# Patient Record
Sex: Female | Born: 1935
Health system: Southern US, Community
[De-identification: ages and names within clinical notes are randomized; demographics above are authoritative.]

## PROBLEM LIST (undated history)

## (undated) DIAGNOSIS — K5790 Diverticulosis of intestine, part unspecified, without perforation or abscess without bleeding: Secondary | ICD-10-CM

## (undated) DIAGNOSIS — I671 Cerebral aneurysm, nonruptured: Secondary | ICD-10-CM

## (undated) DIAGNOSIS — I10 Essential (primary) hypertension: Secondary | ICD-10-CM

## (undated) DIAGNOSIS — R32 Unspecified urinary incontinence: Secondary | ICD-10-CM

## (undated) DIAGNOSIS — K635 Polyp of colon: Secondary | ICD-10-CM

## (undated) HISTORY — DX: Unspecified urinary incontinence: R32

## (undated) HISTORY — PX: OTHER SURGICAL HISTORY: SHX169

## (undated) HISTORY — DX: Diverticulosis of intestine, part unspecified, without perforation or abscess without bleeding: K57.90

## (undated) HISTORY — DX: Essential (primary) hypertension: I10

## (undated) HISTORY — DX: Polyp of colon: K63.5

## (undated) HISTORY — DX: Cerebral aneurysm, nonruptured: I67.1

---

## 2003-08-09 ENCOUNTER — Encounter: Admission: RE | Admit: 2003-08-09 | Discharge: 2003-08-09 | Payer: Self-pay | Admitting: Internal Medicine

## 2003-12-08 ENCOUNTER — Ambulatory Visit (HOSPITAL_COMMUNITY): Admission: RE | Admit: 2003-12-08 | Discharge: 2003-12-08 | Payer: Self-pay | Admitting: Internal Medicine

## 2004-05-31 ENCOUNTER — Ambulatory Visit: Payer: Self-pay | Admitting: Internal Medicine

## 2004-06-21 ENCOUNTER — Ambulatory Visit: Payer: Self-pay | Admitting: Internal Medicine

## 2004-07-06 ENCOUNTER — Ambulatory Visit: Payer: Self-pay | Admitting: Internal Medicine

## 2004-07-06 ENCOUNTER — Other Ambulatory Visit: Admission: RE | Admit: 2004-07-06 | Discharge: 2004-07-06 | Payer: Self-pay | Admitting: Internal Medicine

## 2004-07-06 LAB — HM PAP SMEAR

## 2004-07-08 ENCOUNTER — Ambulatory Visit: Payer: Self-pay | Admitting: Internal Medicine

## 2004-07-08 LAB — HM COLONOSCOPY

## 2004-07-13 ENCOUNTER — Ambulatory Visit: Payer: Self-pay | Admitting: Internal Medicine

## 2004-09-06 ENCOUNTER — Ambulatory Visit: Payer: Self-pay | Admitting: Internal Medicine

## 2005-01-31 ENCOUNTER — Ambulatory Visit (HOSPITAL_COMMUNITY): Admission: RE | Admit: 2005-01-31 | Discharge: 2005-01-31 | Payer: Self-pay | Admitting: Internal Medicine

## 2009-06-12 ENCOUNTER — Encounter (INDEPENDENT_AMBULATORY_CARE_PROVIDER_SITE_OTHER): Payer: Self-pay | Admitting: *Deleted

## 2010-04-13 NOTE — Letter (Signed)
Summary: Colonoscopy Letter  [facility]  [address] [place], [place] [address]   Phone: [phone]  Fax: [phone]      June 12, 2009 MRN: [id number]   [facility] [address] [place], [place]  [address]   Dear Ms. Robotham,   According to your medical record, it is time for you to schedule a Colonoscopy. The American Cancer Society recommends this procedure as a method to detect early colon cancer. Patients with a family history of colon cancer, or a personal history of colon polyps or inflammatory bowel disease are at increased risk.  This letter has beeen generated based on the recommendations made at the time of your procedure. If you feel that in your particular situation this may no longer apply, please contact our office.  Please call our office at [phone] to schedule this appointment or to update your records at your earliest convenience.  Thank you for cooperating with [place] to provide you with the very best care possible.   Sincerely,  Iva Boop, M.D.  [facility] [phone]

## 2010-10-14 ENCOUNTER — Telehealth: Payer: Self-pay | Admitting: Gastroenterology

## 2010-10-14 NOTE — Telephone Encounter (Signed)
Talked to the patient and she stated that her husband is under chemotherapy care and right now things are difficult. Patient stated that she know she needs this done and she will call us back to schedule her colonoscopy appointment.

## 2012-12-12 ENCOUNTER — Ambulatory Visit (INDEPENDENT_AMBULATORY_CARE_PROVIDER_SITE_OTHER): Payer: Medicare Other | Admitting: Family

## 2012-12-12 ENCOUNTER — Encounter: Payer: Self-pay | Admitting: Family

## 2012-12-12 VITALS — BP 142/80 | HR 67 | Ht 62.0 in | Wt 154.0 lb

## 2012-12-12 DIAGNOSIS — Z860100 Personal history of colon polyps, unspecified: Secondary | ICD-10-CM

## 2012-12-12 DIAGNOSIS — Z23 Encounter for immunization: Secondary | ICD-10-CM

## 2012-12-12 DIAGNOSIS — I10 Essential (primary) hypertension: Secondary | ICD-10-CM

## 2012-12-12 DIAGNOSIS — K5730 Diverticulosis of large intestine without perforation or abscess without bleeding: Secondary | ICD-10-CM

## 2012-12-12 LAB — CBC WITH DIFFERENTIAL/PLATELET
Basophils Absolute: 0 10*3/uL (ref 0.0–0.1)
Eosinophils Absolute: 0.2 10*3/uL (ref 0.0–0.7)
Eosinophils Relative: 4 % (ref 0.0–5.0)
Lymphocytes Relative: 28.6 % (ref 12.0–46.0)
MCHC: 33.5 g/dL (ref 30.0–36.0)
MCV: 90.8 fl (ref 78.0–100.0)
Monocytes Relative: 8 % (ref 3.0–12.0)
Neutrophils Relative %: 58.9 % (ref 43.0–77.0)
Platelets: 165 10*3/uL (ref 150.0–400.0)
RBC: 4.5 Mil/uL (ref 3.87–5.11)
RDW: 14.1 % (ref 11.5–14.6)
WBC: 5.6 10*3/uL (ref 4.5–10.5)

## 2012-12-12 LAB — BASIC METABOLIC PANEL
BUN: 15 mg/dL (ref 6–23)
CO2: 31 mEq/L (ref 19–32)
Chloride: 106 mEq/L (ref 96–112)
Creatinine, Ser: 1 mg/dL (ref 0.4–1.2)
GFR: 57 mL/min — ABNORMAL LOW (ref >60.00)
Glucose, Bld: 89 mg/dL (ref 70–99)
Potassium: 5.3 mEq/L — ABNORMAL HIGH (ref 3.5–5.1)
Sodium: 142 mEq/L (ref 135–145)

## 2012-12-12 LAB — HEPATIC FUNCTION PANEL
ALT: 16 U/L (ref 0–35)
Albumin: 3.8 g/dL (ref 3.5–5.2)
Alkaline Phosphatase: 75 U/L (ref 39–117)
Bilirubin, Direct: 0.2 mg/dL (ref 0.0–0.3)
Total Bilirubin: 1.2 mg/dL (ref 0.3–1.2)
Total Protein: 7.1 g/dL (ref 6.0–8.3)

## 2012-12-12 NOTE — Progress Notes (Signed)
  Subjective:    Patient ID: Barbara [name], female    DOB: [birth year], 79 y.o.   MRN: [id number]  HPI And 79 year old white female, new patient him to be reestablished. She has a past medical history of hypertension, colon polyps, diverticulosis. She is a former patient of Dr. Cato Mulligan and has not been here for several years. Reports her husband died 2 years ago due to lymphoma. He reportedly walks 3 miles a day and tries to stay active and healthy. She has not had a colonoscopy in several years and does not want one now. Declining a mammogram as well. She is not currently on any medications.  Review of Systems  Constitutional: Negative.   HENT: Negative.   Respiratory: Negative.   Cardiovascular: Negative.   Gastrointestinal: Negative.   Endocrine: Negative.   Genitourinary: Negative.   Musculoskeletal: Negative.   Skin: Negative.   Allergic/Immunologic: Negative.   Neurological: Negative.   Hematological: Negative.   Psychiatric/Behavioral: Negative.    Past Medical History  Diagnosis Date  . Diverticulosis   . Hypertension   . Colonic polyp   . Urine incontinence     History   Social History  . Marital Status: Married    Spouse Name: N/A    Number of Children: N/A  . Years of Education: N/A   Occupational History  . Not on file.   Social History Main Topics  . Smoking status: Former Games developer  . Smokeless tobacco: Not on file  . Alcohol Use: No  . Drug Use: No  . Sexual Activity: Not on file   Other Topics Concern  . Not on file   Social History Narrative  . No narrative on file    History reviewed. No pertinent past surgical history.  No family history on file.  No Known Allergies  No current outpatient prescriptions on file prior to visit.   No current facility-administered medications on file prior to visit.    BP 142/80  Pulse 67  Ht 5\' 2"  (1.575 m)  Wt 154 lb (69.854 kg)  BMI 28.16 kg/m2  SpO2 98%chart    Objective:   Physical Exam   Constitutional: She is oriented to person, place, and time. She appears well-developed and well-nourished.  HENT:  Right Ear: External ear normal.  Left Ear: External ear normal.  Nose: Nose normal.  Mouth/Throat: Oropharynx is clear and moist.  Neck: Normal range of motion. Neck supple.  Cardiovascular: Normal rate, regular rhythm and normal heart sounds.   Pulmonary/Chest: Effort normal and breath sounds normal.  Abdominal: Soft. Bowel sounds are normal.  Musculoskeletal: She exhibits edema.  Neurological: She is alert and oriented to person, place, and time.  Skin: Skin is warm and dry.  Psychiatric: She has a normal mood and affect.          Assessment & Plan:  Assessment: 1. Hypertension 2. Colon polyps 3. Diverticulosis  Plan: Encouraged healthy diet, exercise, monthly self breast exams. Followup for complete physical exam in 3-4 months and sooner as needed.

## 2012-12-18 ENCOUNTER — Telehealth: Payer: Self-pay | Admitting: Family

## 2012-12-18 NOTE — Telephone Encounter (Signed)
Patient wants to discuss her labs. She wanted a cholesterol, but did not see one. Please advise and call.

## 2012-12-19 ENCOUNTER — Other Ambulatory Visit: Payer: Self-pay | Admitting: Family

## 2012-12-19 DIAGNOSIS — Z1231 Encounter for screening mammogram for malignant neoplasm of breast: Secondary | ICD-10-CM

## 2012-12-19 NOTE — Telephone Encounter (Signed)
Pt aware Lipid panel was not drawn because she was not fasting. We will obtain a lipid panel at her [facility]

## 2012-12-28 ENCOUNTER — Ambulatory Visit (HOSPITAL_COMMUNITY)
Admission: RE | Admit: 2012-12-28 | Discharge: 2012-12-28 | Disposition: A | Discharge status: Home / Self Care | Payer: Medicare Other | Source: Ambulatory Visit | Attending: Family | Admitting: Family

## 2012-12-28 DIAGNOSIS — Z1231 Encounter for screening mammogram for malignant neoplasm of breast: Secondary | ICD-10-CM | POA: Insufficient documentation

## 2013-01-03 ENCOUNTER — Encounter: Payer: Self-pay | Admitting: Family

## 2013-01-07 ENCOUNTER — Encounter: Payer: Self-pay | Admitting: Family

## 2013-01-08 ENCOUNTER — Ambulatory Visit (HOSPITAL_COMMUNITY): Payer: Medicare Other

## 2013-01-10 ENCOUNTER — Encounter: Payer: Self-pay | Admitting: Family

## 2013-01-17 ENCOUNTER — Other Ambulatory Visit: Payer: Self-pay

## 2014-09-08 ENCOUNTER — Other Ambulatory Visit: Payer: Self-pay

## 2014-11-18 ENCOUNTER — Telehealth: Payer: Self-pay | Admitting: Family

## 2014-11-18 NOTE — Telephone Encounter (Signed)
We can put her in an acute slot

## 2014-11-18 NOTE — Telephone Encounter (Signed)
Pls advise.  

## 2014-11-18 NOTE — Telephone Encounter (Signed)
Pt has an appt on 12/23/14 to transfer care to [facility].  Pt states she has been experiencing jaw pain that she has seen her dentist for.  Her dentist suggested she follow up with her PCP.  Pt wants to know if she can be worked in prior to 10/11.

## 2014-11-20 NOTE — Telephone Encounter (Signed)
We can put her in an acute spot and I will see her for her jaw pain

## 2014-11-20 NOTE — Telephone Encounter (Signed)
Pt aware she will be seen for jaw pain only and must keep her oct appt to est care.

## 2014-11-20 NOTE — Telephone Encounter (Signed)
Please clarify, would you like pt scheduled/ worked-in in an acute slot for the acute problem she is having now.  Should she keep the appt on 12/23/14 to establish care?

## 2014-11-24 ENCOUNTER — Telehealth: Payer: Self-pay | Admitting: Family

## 2014-11-24 ENCOUNTER — Encounter: Payer: Self-pay | Admitting: Adult Health

## 2014-11-24 ENCOUNTER — Ambulatory Visit (INDEPENDENT_AMBULATORY_CARE_PROVIDER_SITE_OTHER): Payer: Medicare Other | Admitting: Adult Health

## 2014-11-24 VITALS — BP 110/84 | Temp 97.8°F | Ht 62.0 in | Wt 156.0 lb

## 2014-11-24 DIAGNOSIS — R51 Headache: Secondary | ICD-10-CM | POA: Diagnosis not present

## 2014-11-24 DIAGNOSIS — K146 Glossodynia: Secondary | ICD-10-CM

## 2014-11-24 DIAGNOSIS — R519 Headache, unspecified: Secondary | ICD-10-CM

## 2014-11-24 LAB — BASIC METABOLIC PANEL
BUN: 22 mg/dL (ref 6–23)
CHLORIDE: 103 meq/L (ref 96–112)
CO2: 30 meq/L (ref 19–32)
Calcium: 9.7 mg/dL (ref 8.4–10.5)
Creatinine, Ser: 0.86 mg/dL (ref 0.40–1.20)
GFR: 67.5 mL/min (ref >60.00)
Glucose, Bld: 102 mg/dL — ABNORMAL HIGH (ref 70–99)
Potassium: 4.3 mEq/L (ref 3.5–5.1)
Sodium: 142 mEq/L (ref 135–145)

## 2014-11-24 MED ORDER — BACLOFEN 10 MG PO TABS: 10.0000 mg | ORAL_TABLET | Freq: Three times a day (TID) | ORAL | Status: DC

## 2014-11-24 NOTE — Patient Instructions (Signed)
It was great meeting you today.   I have sent in a prescription for Baclofen. Take this three times a day as needed.   I have sent in a referral to the ENT doctors, they will call you to schedule an appointment.   I will follow up with you regarding your  MRI

## 2014-11-24 NOTE — Telephone Encounter (Signed)
Pt can not having mri at [facility] until  12-10-14. Pt would like to see if hospital  can do mri sooner

## 2014-11-24 NOTE — Progress Notes (Signed)
Subjective:    Patient ID: Barbara [name], female    DOB: Mar 04, 1933, 81 y.o.   MRN: [id number]  HPI  81 year old female who presents to the office today for jaw pain/tongue pain/headache that started when she work up on the morning of  August 16th, 2016. The pain started out of both sides of her jaw " it felt like it was on the outside of my jaw." The pain was described as " intense". She wears a mouth guard at night. She denies any feeling of her jaw locking or popping. At the same time, she had "tongue pain", feels as though it is "aching" in quality an can be "burning" at time. Only has pain when she eats.The pain is located " in the back of her tongue".  She continues to have have a headache on the top of her head. This headache has not gone away since August 16th. She has tried Ibuprofen and Tylenol without relief.   The jaw pain has resolved but that she continues to have tongue pain, which has been getting slightly better.   Denies any blurred vision, slurred speech, facial droop. Loss of sensation.    She has been seen and examined by her Dentist who could not figure out a reason of her pain.   Review of Systems  Constitutional: Negative.   HENT: Positive for ear pain (occassional ). Negative for drooling, ear discharge, postnasal drip, rhinorrhea, sinus pressure, tinnitus, trouble swallowing and voice change.   Eyes: Negative.   Respiratory: Negative.   Musculoskeletal: Negative.   Neurological: Positive for headaches. Negative for dizziness, tremors, seizures, syncope, facial asymmetry, speech difficulty, weakness, light-headedness and numbness.  Psychiatric/Behavioral: Negative.   All other systems reviewed and are negative.  Past Medical History  Diagnosis Date  . Diverticulosis   . Hypertension   . Colonic polyp   . Urine incontinence     Social History   Social History  . Marital Status: Married    Spouse Name: N/A  . Number of Children: N/A  . Years of  Education: N/A   Occupational History  . Not on file.   Social History Main Topics  . Smoking status: Former Games developer  . Smokeless tobacco: Not on file  . Alcohol Use: No  . Drug Use: No  . Sexual Activity: Not on file   Other Topics Concern  . Not on file   Social History Narrative    No past surgical history on file.  No family history on file.  No Known Allergies  Current Outpatient Prescriptions on File Prior to Visit  Medication Sig Dispense Refill  . Calcium-Vitamin D-Vitamin K 500-500-40 MG-UNT-MCG CHEW Chew by mouth.    . fish oil-omega-3 fatty acids 1000 MG capsule Take 2 g by mouth daily.    . Methylsulfonylmethane (MSM PO) Take by mouth.    . NON FORMULARY Slimfast powder     No current facility-administered medications on file prior to visit.    BP 110/84 mmHg  Temp(Src) 97.8 F (36.6 C) (Oral)  Ht  (1.575 m)  Wt 156 lb (70.761 kg)  BMI 28.53 kg/m2       Objective:   Physical Exam  Constitutional: She is oriented to person, place, and time. She appears well-developed and well-nourished. No distress.  HENT:  Head: Normocephalic and atraumatic.  Right Ear: External ear normal.  Left Ear: External ear normal.  Nose: Nose normal.  Mouth/Throat: Oropharynx is clear and moist. No oropharyngeal exudate.  No signs of infection inside her mouth.   No popping or locking of jaw. No pain to TMJ with palpation.   Eyes: Conjunctivae and EOM are normal. Pupils are equal, round, and reactive to light. Right eye exhibits no discharge. Left eye exhibits no discharge.  Neck: No thyromegaly present.  Cardiovascular: Normal rate, regular rhythm, normal heart sounds and intact distal pulses.  Exam reveals no gallop and no friction rub.   No murmur heard. Pulmonary/Chest: Effort normal and breath sounds normal. No respiratory distress. She has no wheezes. She has no rales. She exhibits no tenderness.  Musculoskeletal: Normal range of motion. She exhibits no edema  or tenderness.  Lymphadenopathy:    She has no cervical adenopathy.  Neurological: She is alert and oriented to person, place, and time. She has normal reflexes. She displays normal reflexes. A cranial nerve deficit (possible Glossopharyngeal nerve involvement) is present. She exhibits normal muscle tone. Coordination normal.  No signs of stroke  Skin: Skin is warm and dry. No rash noted. She is not diaphoretic. No erythema. No pallor.  Psychiatric: She has a normal mood and affect. Her behavior is normal. Judgment and thought content normal.  Nursing note and vitals reviewed.      Assessment & Plan:  1. Acute nonintractable headache, unspecified headache type - MR Angiogram Head Wo Contrast; Future - MR Brain W Wo Contrast; Future - Basic metabolic panel  2. Tongue pain - possible Glossopharyngeal neuralgia. Cannot rule out  mass lesion or vascular pathology. Possible Trigeminal neuralgia - baclofen (LIORESAL) 10 MG tablet; Take 1 tablet (10 mg total) by mouth 3 (three) times daily.  Dispense: 30 each; Refill: 0 - MR Angiogram Head Wo Contrast; Future - Ambulatory referral to ENT - MR Brain W Wo Contrast; Future - Basic metabolic panel

## 2014-11-24 NOTE — Telephone Encounter (Signed)
Called and spoke with [facility].  The reason pt was scheduled out to 9.28.2016 is because she is claustrophobic and that room fills up quickly.  Per scheduler pt will have to call back periodically to see if there has been a cancellation.  Called and spoke with Gavin Pound [facility] and she states that if pt would like to switch to the hospital the old referral will have to be deleted and the process will start over. Kandee Keen would like for this to be done within the next week.   Spoke with Gavin Pound and she states that [facility] is the only place with an open MRI. Please advise.

## 2014-11-24 NOTE — Telephone Encounter (Signed)
Can we call her and see if she can do the exam without it being an open MRI. If she truly is claustrophobic I can get her an ativan to take prior to the procedure, if she has a ride.

## 2014-11-24 NOTE — Progress Notes (Signed)
Pre visit review using our clinic review tool, if applicable. No additional management support is needed unless otherwise documented below in the visit note. 

## 2014-11-25 NOTE — Telephone Encounter (Signed)
Called and spoke with pt and pt states it is not that she is claustrophobic it is that she has had back and if she lies down flat she will get back spasms.  Pt states she also cancelled the appointment with ENT because she would like to do one thing at a time and the MRI is the first thing she would like to do.  Advised Cory of pt's decision and he states ideally he would like for pt to have MRI asap but if pt would like to wait for the open MRI that is fine.  Advised pt that Kandee Keen is aware and would like pt to seek medical attention if symptoms worsen or new symptoms arise.

## 2014-12-04 ENCOUNTER — Telehealth: Payer: Self-pay | Admitting: Family

## 2014-12-04 NOTE — Telephone Encounter (Signed)
Pt call to say that she found the following  It fits her condition trigeminal neuralgia

## 2014-12-04 NOTE — Telephone Encounter (Signed)
See Below:

## 2014-12-09 ENCOUNTER — Telehealth: Payer: Self-pay | Admitting: Adult Health

## 2014-12-09 NOTE — Telephone Encounter (Signed)
Attempted to call patient about possible trigeminal neurelgia and her upcoming MRI. No answer

## 2014-12-10 ENCOUNTER — Ambulatory Visit
Admission: RE | Admit: 2014-12-10 | Discharge: 2014-12-10 | Disposition: A | Discharge status: Home / Self Care | Payer: Medicare Other | Source: Ambulatory Visit | Attending: Adult Health | Admitting: Adult Health

## 2014-12-10 DIAGNOSIS — K146 Glossodynia: Secondary | ICD-10-CM

## 2014-12-10 DIAGNOSIS — R519 Headache, unspecified: Secondary | ICD-10-CM

## 2014-12-10 DIAGNOSIS — R51 Headache: Principal | ICD-10-CM

## 2014-12-10 MED ORDER — GADOBENATE DIMEGLUMINE 529 MG/ML IV SOLN
14.0000 mL | Freq: Once | INTRAVENOUS | Status: AC | PRN
  Administered 2014-12-10: 14 mL via INTRAVENOUS

## 2014-12-11 ENCOUNTER — Other Ambulatory Visit: Payer: Self-pay | Admitting: Adult Health

## 2014-12-11 ENCOUNTER — Telehealth: Payer: Self-pay | Admitting: Adult Health

## 2014-12-11 ENCOUNTER — Encounter: Payer: Self-pay | Admitting: Adult Health

## 2014-12-11 DIAGNOSIS — I671 Cerebral aneurysm, nonruptured: Secondary | ICD-10-CM | POA: Insufficient documentation

## 2014-12-11 NOTE — Telephone Encounter (Signed)
Spoke to patient on the phone and informed her of her MRI results. 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 I will send in referral to Neuro surgery.

## 2014-12-15 DIAGNOSIS — I671 Cerebral aneurysm, nonruptured: Secondary | ICD-10-CM | POA: Insufficient documentation

## 2014-12-17 ENCOUNTER — Telehealth: Payer: Self-pay | Admitting: Family

## 2014-12-17 NOTE — Telephone Encounter (Signed)
Pt has questions about something she see on mycharts about her MRI would like a call back .

## 2014-12-17 NOTE — Telephone Encounter (Signed)
Attempted to call pt; no answer. Will call again at a later time.

## 2014-12-18 ENCOUNTER — Telehealth: Payer: Self-pay | Admitting: Adult Health

## 2014-12-18 NOTE — Telephone Encounter (Signed)
Pt states in the results part of her angiogram head w/o contrast ( MRI) and  results from [facility] (MRA) Are the same exact words . Pt concerned same results were entered for both Please advise and call back.

## 2014-12-18 NOTE — Telephone Encounter (Signed)
Spoke with pt and pt states she saw the neurosurgeon and had labs done.  Pt states the neurosurgeon also went over her MRI results.  Pt was not sure if the neurosurgeon was going to send the labs to [facility] of if his office was going to call.  Advised pt that the neurosurgeon would contact her about the labs drawn at his office.  Pt verbalized understanding and states she will call him.  Advised pt if further assistance was needed to call back.

## 2014-12-18 NOTE — Telephone Encounter (Signed)
Attempted to call pt no answer  

## 2014-12-18 NOTE — Telephone Encounter (Signed)
DUPLICATE MESSAGE: Please review and advise.   Pt states in the results part of her angiogram head w/o contrast ( MRI) and results from [facility] (MRA). Are the same exact words . Pt concerned same results were entered for both Please advise and call back.

## 2014-12-19 NOTE — Telephone Encounter (Signed)
Called and spoke with pt and pt is aware.  

## 2014-12-19 NOTE — Telephone Encounter (Signed)
That is correct. They are two separate tests but are evaluated together.

## 2014-12-23 ENCOUNTER — Ambulatory Visit: Payer: Self-pay | Admitting: Adult Health

## 2014-12-23 ENCOUNTER — Ambulatory Visit: Admit: 2014-12-23 | Payer: Self-pay | Admitting: Neurosurgery

## 2014-12-23 SURGERY — BIOPSY TEMPORAL ARTERY
Anesthesia: General

## 2014-12-24 ENCOUNTER — Telehealth: Payer: Self-pay | Admitting: Adult Health

## 2014-12-24 DIAGNOSIS — I671 Cerebral aneurysm, nonruptured: Secondary | ICD-10-CM

## 2014-12-24 DIAGNOSIS — M316 Other giant cell arteritis: Secondary | ICD-10-CM

## 2014-12-24 NOTE — Telephone Encounter (Signed)
Pt said the neuro doctor suggest she see a Rheumatologist. Pt is calling asking for a referral to see that kind of doctor. Pt would like a call back.

## 2014-12-24 NOTE — Telephone Encounter (Signed)
Ok per [facility] to place referral. Called and spoke with pt and pt is aware that referral will be placed.  Advised pt that we had received office notes from Neuro.

## 2014-12-25 ENCOUNTER — Encounter: Payer: Self-pay | Admitting: Adult Health

## 2014-12-26 ENCOUNTER — Encounter: Payer: Self-pay | Admitting: Adult Health

## 2014-12-29 ENCOUNTER — Telehealth: Payer: Self-pay | Admitting: Adult Health

## 2014-12-29 NOTE — Telephone Encounter (Signed)
Ok with me 

## 2014-12-29 NOTE — Telephone Encounter (Signed)
That is ok with me  

## 2014-12-29 NOTE — Telephone Encounter (Signed)
Barbara [name],  Patient is requesting to switch to Dr. Lawerance BachBurns over here @ [facility]. Is this something you're ok with?   Dr. Lawerance BachBurns,  Would you be ok with accepting this patient as a transfer?

## 2014-12-29 NOTE — Telephone Encounter (Signed)
Called scheduled appt  °

## 2015-01-05 ENCOUNTER — Other Ambulatory Visit (INDEPENDENT_AMBULATORY_CARE_PROVIDER_SITE_OTHER): Payer: Medicare Other

## 2015-01-05 ENCOUNTER — Ambulatory Visit (INDEPENDENT_AMBULATORY_CARE_PROVIDER_SITE_OTHER): Payer: Medicare Other | Admitting: Internal Medicine

## 2015-01-05 ENCOUNTER — Encounter: Payer: Self-pay | Admitting: Internal Medicine

## 2015-01-05 VITALS — BP 150/92 | HR 62 | Temp 98.4°F | Resp 16 | Ht 63.0 in | Wt 151.0 lb

## 2015-01-05 DIAGNOSIS — I671 Cerebral aneurysm, nonruptured: Secondary | ICD-10-CM | POA: Diagnosis not present

## 2015-01-05 DIAGNOSIS — M316 Other giant cell arteritis: Secondary | ICD-10-CM

## 2015-01-05 DIAGNOSIS — Z8739 Personal history of other diseases of the musculoskeletal system and connective tissue: Secondary | ICD-10-CM | POA: Insufficient documentation

## 2015-01-05 LAB — CBC WITH DIFFERENTIAL/PLATELET
Basophils Absolute: 0 10*3/uL (ref 0.0–0.1)
Basophils Relative: 0 % (ref 0.0–3.0)
EOS ABS: 0 10*3/uL (ref 0.0–0.7)
Eosinophils Relative: 0 % (ref 0.0–5.0)
HEMATOCRIT: 45.8 % (ref 36.0–46.0)
HEMOGLOBIN: 15.1 g/dL — AB (ref 12.0–15.0)
Lymphocytes Relative: 6.7 % — ABNORMAL LOW (ref 12.0–46.0)
Lymphs Abs: 0.9 10*3/uL (ref 0.7–4.0)
MCHC: 32.9 g/dL (ref 30.0–36.0)
MCV: 89.9 fl (ref 78.0–100.0)
Monocytes Absolute: 0.3 10*3/uL (ref 0.1–1.0)
Monocytes Relative: 2.5 % — ABNORMAL LOW (ref 3.0–12.0)
NEUTROS ABS: 11.9 10*3/uL — AB (ref 1.4–7.7)
Neutrophils Relative %: 90.8 % — ABNORMAL HIGH (ref 43.0–77.0)
PLATELETS: 145 10*3/uL — AB (ref 150.0–400.0)
RBC: 5.09 Mil/uL (ref 3.87–5.11)
RDW: 16.3 % — ABNORMAL HIGH (ref 11.5–15.5)
WBC: 13.1 10*3/uL — ABNORMAL HIGH (ref 4.0–10.5)

## 2015-01-05 LAB — COMPREHENSIVE METABOLIC PANEL
ALT: 34 U/L (ref 0–35)
AST: 25 U/L (ref 0–37)
Albumin: 3.7 g/dL (ref 3.5–5.2)
Alkaline Phosphatase: 84 U/L (ref 39–117)
BUN: 32 mg/dL — ABNORMAL HIGH (ref 6–23)
CO2: 31 mEq/L (ref 19–32)
Calcium: 9.8 mg/dL (ref 8.4–10.5)
Chloride: 100 mEq/L (ref 96–112)
Creatinine, Ser: 0.97 mg/dL (ref 0.40–1.20)
GFR: 58.73 mL/min — ABNORMAL LOW (ref >60.00)
Glucose, Bld: 122 mg/dL — ABNORMAL HIGH (ref 70–99)
Potassium: 4.2 mEq/L (ref 3.5–5.1)
Sodium: 138 mEq/L (ref 135–145)
Total Bilirubin: 1.3 mg/dL — ABNORMAL HIGH (ref 0.2–1.2)
Total Protein: 6.6 g/dL (ref 6.0–8.3)

## 2015-01-05 LAB — SEDIMENTATION RATE: Sed Rate: 9 mm/hr (ref 0–22)

## 2015-01-05 LAB — HIGH SENSITIVITY CRP: CRP HIGH SENSITIVITY: 1.03 mg/L (ref 0.000–5.000)

## 2015-01-05 NOTE — Patient Instructions (Addendum)
  We have reviewed your prior records including labs and tests today.  Test(s) ordered today. Your results will be released to MyChart (or called to you) after review, usually within 72hours after test completion. If any changes need to be made, you will be notified at that same time.  Medications reviewed and updated.  No changes recommended at this time.   A referral for rheumatology has been ordered.  Please schedule followup in 2 months

## 2015-01-05 NOTE — Assessment & Plan Note (Signed)
She will follow up with her ophthalmologist

## 2015-01-05 NOTE — Progress Notes (Signed)
Pre visit review using our clinic review tool, if applicable. No additional management support is needed unless otherwise documented below in the visit note. 

## 2015-01-05 NOTE — Assessment & Plan Note (Signed)
Will check esr,crp, cbc Since her symptoms have resolved with the prednisone as long as her blood work is normal we will taper the steroids Already referred to rheumatology Discussed that during the tapering process if she has any symptoms that recur she needs to call immediately so we can increase the steroids

## 2015-01-05 NOTE — Progress Notes (Signed)
Subjective:    Patient ID: Barbara [name], female    DOB: 18-Nov-1932, 81 y.o.   MRN: [id number]  HPI She is here to establish with a new pcp.  She was recently diagnosed with temporal arteritis and has questions regarding it.   August 15th she woke up with bilateral jaw and tongue pain.  Her temporal artery was swollen and painful on each side.  She had a painful spot on her left upper head.  The pain was severe.  She first saw her dentist.  The pain slowly diminished.  She was placed on baclofen 9/12, then methylprednisolone on 10/3, then prednisone 10/7.  She is currently taking 40 mg of prednisone a day.  Her symptoms have resolved.  She is having some mild side effects from the prednisone (palpitations, lightheadedness).  She has been referred to rheumatology.  She had a MRI of her head and was found to have an aneurysm behind her left eye.  She has not yet followed up with her ophthalmologist.  She denies changes in her vision.    Walks three miles day .  palpaiattions on occ, inc thrist  Medications and allergies reviewed with patient and updated if appropriate.  Patient Active Problem List   Diagnosis Date Noted  . Aneurysm, ophthalmic artery 12/11/2014    Past Medical History  Diagnosis Date  . Diverticulosis   . Hypertension   . Colonic polyp   . Urine incontinence   . Aneurysm, ophthalmic artery     Past Surgical History  Procedure Laterality Date  . Disk removal      Social History   Social History  . Marital Status: Married    Spouse Name: N/A  . Number of Children: N/A  . Years of Education: N/A   Social History Main Topics  . Smoking status: Former Games developer  . Smokeless tobacco: None  . Alcohol Use: No  . Drug Use: No  . Sexual Activity: Not Asked   Other Topics Concern  . None   Social History Narrative    Review of Systems  Constitutional: Positive for fatigue (slight with prednisone). Negative for fever and chills.  Eyes: Negative for  visual disturbance.  Respiratory: Negative for cough, shortness of breath and wheezing.   Cardiovascular: Positive for palpitations (from prednisone). Negative for chest pain and leg swelling.  Gastrointestinal: Negative for nausea and abdominal pain.       No GERD  Genitourinary: Negative for dysuria.       Urinary incontinence  Musculoskeletal: Positive for arthralgias (arthritis in hands).  Neurological: Positive for light-headedness (from prednisone). Negative for headaches.  Psychiatric/Behavioral: Negative for dysphoric mood. The patient is not nervous/anxious.        Objective:   Filed Vitals:   01/05/15 1516  BP: 150/92  Pulse: 62  Temp: 98.4 F (36.9 C)  Resp: 16   Filed Weights   01/05/15 1516  Weight: 151 lb (68.493 kg)   Body mass index is 26.76 kg/(m^2).   Physical Exam  Constitutional: She is oriented to person, place, and time. She appears well-developed and well-nourished. No distress.  HENT:  Head: Normocephalic and atraumatic.  Right Ear: External ear normal.  Left Ear: External ear normal.  Mouth/Throat: Oropharynx is clear and moist.  No jaw pain, no temporal artery redness/swelling/pain  Eyes: Conjunctivae are normal.  Neck: Normal range of motion. Neck supple. No tracheal deviation present. No thyromegaly present.  No carotid bruit  Cardiovascular: Normal rate, regular rhythm and normal  heart sounds.   No murmur heard. Pulmonary/Chest: Effort normal and breath sounds normal. No respiratory distress. She has no wheezes.  Abdominal: Soft. She exhibits no distension. There is no tenderness.  Musculoskeletal: She exhibits no edema.  Lymphadenopathy:    She has no cervical adenopathy.  Neurological: She is alert and oriented to person, place, and time. No cranial nerve deficit.  Skin: Skin is warm and dry. No rash noted.  Psychiatric: She has a normal mood and affect. Her behavior is normal.         Assessment & Plan:   Elevated BP: Usually  well controlled Just monitor for now, may be related to steroids  See Problem List.

## 2015-01-06 ENCOUNTER — Other Ambulatory Visit: Payer: Self-pay | Admitting: Emergency Medicine

## 2015-01-06 MED ORDER — PREDNISONE 20 MG PO TABS: 30.0000 mg | ORAL_TABLET | Freq: Every day | ORAL | Status: DC

## 2015-01-07 ENCOUNTER — Telehealth: Payer: Self-pay | Admitting: Emergency Medicine

## 2015-01-07 DIAGNOSIS — M316 Other giant cell arteritis: Secondary | ICD-10-CM

## 2015-01-07 NOTE — Telephone Encounter (Signed)
Referral ordered

## 2015-01-07 NOTE — Telephone Encounter (Signed)
Spoke with pt. She would like Rheum Referral to be put back in. Pt was informed that she may get the same Dr the she was scheduled with before cancelling the appt.

## 2015-01-07 NOTE — Telephone Encounter (Signed)
Returned your phone call

## 2015-01-07 NOTE — Telephone Encounter (Signed)
LVM for pt to call back regarding referral.

## 2015-01-15 ENCOUNTER — Ambulatory Visit: Payer: Medicare Other | Admitting: Internal Medicine

## 2015-01-20 ENCOUNTER — Other Ambulatory Visit (INDEPENDENT_AMBULATORY_CARE_PROVIDER_SITE_OTHER): Payer: Medicare Other

## 2015-01-20 DIAGNOSIS — M316 Other giant cell arteritis: Secondary | ICD-10-CM | POA: Diagnosis not present

## 2015-01-20 LAB — CBC WITH DIFFERENTIAL/PLATELET
BASOS ABS: 0 10*3/uL (ref 0.0–0.1)
Basophils Relative: 0.1 % (ref 0.0–3.0)
Eosinophils Absolute: 0.1 10*3/uL (ref 0.0–0.7)
Eosinophils Relative: 0.7 % (ref 0.0–5.0)
HCT: 45.7 % (ref 36.0–46.0)
Hemoglobin: 14.9 g/dL (ref 12.0–15.0)
Lymphocytes Relative: 6.1 % — ABNORMAL LOW (ref 12.0–46.0)
Lymphs Abs: 0.6 10*3/uL — ABNORMAL LOW (ref 0.7–4.0)
MCHC: 32.7 g/dL (ref 30.0–36.0)
MCV: 92.3 fl (ref 78.0–100.0)
Monocytes Absolute: 0.6 10*3/uL (ref 0.1–1.0)
Monocytes Relative: 5.7 % (ref 3.0–12.0)
Neutro Abs: 8.5 10*3/uL — ABNORMAL HIGH (ref 1.4–7.7)
Neutrophils Relative %: 87.4 % — ABNORMAL HIGH (ref 43.0–77.0)
Platelets: 111 10*3/uL — ABNORMAL LOW (ref 150.0–400.0)
RBC: 4.96 Mil/uL (ref 3.87–5.11)
RDW: 17.5 % — ABNORMAL HIGH (ref 11.5–15.5)
WBC: 9.8 10*3/uL (ref 4.0–10.5)

## 2015-01-20 LAB — SEDIMENTATION RATE: Sed Rate: 16 mm/hr (ref 0–22)

## 2015-01-20 LAB — C-REACTIVE PROTEIN: CRP: 2.6 mg/dL (ref 0.5–20.0)

## 2015-01-21 ENCOUNTER — Encounter: Payer: Self-pay | Admitting: Internal Medicine

## 2015-03-11 ENCOUNTER — Encounter: Payer: Self-pay | Admitting: Internal Medicine

## 2015-03-11 DIAGNOSIS — D696 Thrombocytopenia, unspecified: Secondary | ICD-10-CM

## 2015-03-11 DIAGNOSIS — M316 Other giant cell arteritis: Secondary | ICD-10-CM

## 2015-03-12 ENCOUNTER — Encounter: Payer: Self-pay | Admitting: Internal Medicine

## 2015-03-12 ENCOUNTER — Other Ambulatory Visit (INDEPENDENT_AMBULATORY_CARE_PROVIDER_SITE_OTHER): Payer: Medicare Other

## 2015-03-12 DIAGNOSIS — D696 Thrombocytopenia, unspecified: Secondary | ICD-10-CM

## 2015-03-12 DIAGNOSIS — M316 Other giant cell arteritis: Secondary | ICD-10-CM

## 2015-03-12 LAB — CBC WITH DIFFERENTIAL/PLATELET
Basophils Absolute: 0 10*3/uL (ref 0.0–0.1)
Basophils Relative: 0.5 % (ref 0.0–3.0)
EOS PCT: 4.8 % (ref 0.0–5.0)
Eosinophils Absolute: 0.4 10*3/uL (ref 0.0–0.7)
HEMATOCRIT: 38.2 % (ref 36.0–46.0)
Hemoglobin: 12.6 g/dL (ref 12.0–15.0)
LYMPHS ABS: 1.8 10*3/uL (ref 0.7–4.0)
Lymphocytes Relative: 22.4 % (ref 12.0–46.0)
MCHC: 33.2 g/dL (ref 30.0–36.0)
MCV: 89.5 fl (ref 78.0–100.0)
MONOS PCT: 7.6 % (ref 3.0–12.0)
Monocytes Absolute: 0.6 10*3/uL (ref 0.1–1.0)
NEUTROS PCT: 64.7 % (ref 43.0–77.0)
Neutro Abs: 5.2 10*3/uL (ref 1.4–7.7)
Platelets: 240 10*3/uL (ref 150.0–400.0)
RBC: 4.26 Mil/uL (ref 3.87–5.11)
RDW: 15.5 % (ref 11.5–15.5)
WBC: 8 10*3/uL (ref 4.0–10.5)

## 2015-03-12 LAB — C-REACTIVE PROTEIN: CRP: 1.8 mg/dL (ref 0.5–20.0)

## 2015-03-12 LAB — SEDIMENTATION RATE: Sed Rate: 83 mm/hr — ABNORMAL HIGH (ref 0–22)

## 2015-03-13 ENCOUNTER — Telehealth: Payer: Self-pay | Admitting: Internal Medicine

## 2015-03-13 DIAGNOSIS — M316 Other giant cell arteritis: Secondary | ICD-10-CM

## 2015-03-13 DIAGNOSIS — R700 Elevated erythrocyte sedimentation rate: Secondary | ICD-10-CM

## 2015-03-13 NOTE — Telephone Encounter (Signed)
Spoke with pt and she feels that the current Rheumatologist is treating her for what she was sent to him for and would like another referral. Pt is aware that this may take a while to get in for another appt. She has an appt Thursday and would like to know if she should go to that appt. She mentioned being put back on Prednisone, please advise.

## 2015-03-13 NOTE — Telephone Encounter (Signed)
Would like to know if Dr. Lawerance BachBurns is going to refer her to a rheumatologist based on her last lab results

## 2015-03-13 NOTE — Telephone Encounter (Signed)
Referral placed for rheum. What symptoms is she having?  I do not want to just put her on steroids - ideally rheumatology should see her when she has active symptoms.  She should definitely go on thursday.

## 2015-03-18 ENCOUNTER — Encounter: Payer: Self-pay | Admitting: Internal Medicine

## 2015-03-18 ENCOUNTER — Ambulatory Visit (INDEPENDENT_AMBULATORY_CARE_PROVIDER_SITE_OTHER): Payer: Medicare Other | Admitting: Internal Medicine

## 2015-03-18 VITALS — BP 168/98 | HR 71 | Temp 98.1°F | Resp 16 | Wt 154.0 lb

## 2015-03-18 DIAGNOSIS — I10 Essential (primary) hypertension: Secondary | ICD-10-CM | POA: Insufficient documentation

## 2015-03-18 DIAGNOSIS — R030 Elevated blood-pressure reading, without diagnosis of hypertension: Secondary | ICD-10-CM

## 2015-03-18 DIAGNOSIS — M316 Other giant cell arteritis: Secondary | ICD-10-CM

## 2015-03-18 DIAGNOSIS — IMO0001 Reserved for inherently not codable concepts without codable children: Secondary | ICD-10-CM

## 2015-03-18 MED ORDER — PREDNISONE 20 MG PO TABS: 40.0000 mg | ORAL_TABLET | Freq: Every day | ORAL | Status: DC

## 2015-03-18 NOTE — Patient Instructions (Addendum)
  We have reviewed your prior records including labs and tests today.  Test(s) ordered today. Please have blood work done in 1 week. Your results will be released to MyChart (or called to you) after review, usually within 72hours after test completion. If any changes need to be made, you will be notified at that same time.  Medications reviewed and updated. Changes include starting prednisone 40 mg daily.    Your prescription(s) have been submitted to your pharmacy. Please take as directed and contact our office if you believe you are having problem(s) with the medication(s).  Start monitoring your blood pressure daily.

## 2015-03-18 NOTE — Assessment & Plan Note (Addendum)
?  Truly temporal arteritis vs other inflammatory cause ESR elevated Will restart prednisone at 40 mg daily Referral ordered last week for rheumatology - Dr. Eartha Inch esr, crp, cbc in one week Hopefully will be able to establish with rheumatology quickly and they can manage/taper prednisone She will take prednisone with food and monitor for symptoms of GERD, gastric upset - may need prophylactic PPI

## 2015-03-18 NOTE — Assessment & Plan Note (Signed)
BP elevated the last two visits She will purchase a BP monitor for home and start monitoring her BP daily She will update me via [employer] [facility]

## 2015-03-18 NOTE — Progress Notes (Signed)
Pre visit review using our clinic review tool, if applicable. No additional management support is needed unless otherwise documented below in the visit note. 

## 2015-03-18 NOTE — Progress Notes (Signed)
Subjective:    Patient ID: Barbara [name], female    DOB: [birth year], 82 y.o.   MRN: [id number]  HPI  She is here for follow up temporal arteritis. See prior notes for extended past history.  She was diagnosed elsewhere and started on prednisone for presumed temporal arteritis.  She declined a temporal artery biopsy at that time.  She was on prednisone when she started seeing me and I referred her to rheumatology.  Dr. Kellie Simmering did not feel she had temporal arteritis and she was tapered off the steroids.    After she finished the prednisone the headache restarted.  Even on the prednisone she had tenderness on the scalp.  Her current symptoms include:  Her tongue is painful and achy when she eats.  She has tenderness in the left upper posterior parietal region of her head.  She has a headache that is generalized and it is not severe.  The headache moves around a little throughout her head.  Her b/l temporal arteries feel puffy at times and slightly tender at times, but not consistently.    She denies double vision, blurry vision and eye pain.  She denies fevers.   Medications and allergies reviewed with patient and updated if appropriate.  Patient Active Problem List   Diagnosis Date Noted  . Temporal arteritis (HCC) 01/05/2015  . Aneurysm, ophthalmic artery 12/11/2014    Current Outpatient Prescriptions on File Prior to Visit  Medication Sig Dispense Refill  . Calcium-Vitamin D-Vitamin K 500-500-40 MG-UNT-MCG CHEW Chew by mouth.    . fish oil-omega-3 fatty acids 1000 MG capsule Take 2 g by mouth daily.    . Methylsulfonylmethane (MSM PO) Take by mouth.    . NON FORMULARY Slimfast powder    . predniSONE (DELTASONE) 20 MG tablet Take 1.5 tablets (30 mg total) by mouth daily with breakfast. 45 tablet 0   No current facility-administered medications on file prior to visit.    Past Medical History  Diagnosis Date  . Diverticulosis   . Hypertension   . Colonic polyp   . Urine  incontinence   . Aneurysm, ophthalmic artery     Past Surgical History  Procedure Laterality Date  . Disk removal      Social History   Social History  . Marital Status: Married    Spouse Name: N/A  . Number of Children: N/A  . Years of Education: N/A   Social History Main Topics  . Smoking status: Former Games developer  . Smokeless tobacco: None  . Alcohol Use: No  . Drug Use: No  . Sexual Activity: Not Asked   Other Topics Concern  . None   Social History Narrative    No family history on file.  Review of Systems  Constitutional: Positive for fatigue. Negative for fever.  HENT: Negative for congestion, nosebleeds and sinus pressure.   Eyes: Negative for pain and visual disturbance.  Respiratory: Negative for cough, shortness of breath and wheezing.   Cardiovascular: Positive for palpitations (occasional fluttering, transient) and leg swelling (ankle swelling in morning). Negative for chest pain.  Neurological: Positive for light-headedness (possibly one episode, transient) and headaches. Negative for dizziness, weakness and numbness.       Objective:   Filed Vitals:   03/18/15 0947  BP: 168/98  Pulse: 71  Temp: 98.1 F (36.7 C)  Resp: 16   Filed Weights   03/18/15 0947  Weight: 154 lb (69.854 kg)   Body mass index is 27.29 kg/(m^2).  Physical Exam  Constitutional: She is oriented to person, place, and time. She appears well-developed and well-nourished. No distress.  HENT:  Head: Normocephalic and atraumatic.  Right Ear: External ear normal.  Left Ear: External ear normal.  Mouth/Throat: Oropharynx is clear and moist. No oropharyngeal exudate.  Normal ear canals and TM bilaterally  Eyes: Conjunctivae are normal.  Neck: No tracheal deviation present. No thyromegaly present.  No carotid bruit  Cardiovascular: Normal rate, regular rhythm and normal heart sounds.   Pulmonary/Chest: Effort normal and breath sounds normal. No respiratory distress. She has no  wheezes. She has no rales.  Lymphadenopathy:    She has no cervical adenopathy.  Neurological: She is alert and oriented to person, place, and time. No cranial nerve deficit.  Skin: Skin is warm and dry. No rash noted. She is not diaphoretic. No erythema.  Mild tenderness left posterior parietal region, no lesions  Psychiatric: She has a normal mood and affect. Her behavior is normal.          Assessment & Plan:   See Problem List for A/P

## 2015-03-28 ENCOUNTER — Encounter: Payer: Self-pay | Admitting: Internal Medicine

## 2015-03-30 ENCOUNTER — Other Ambulatory Visit: Payer: Self-pay | Admitting: Rheumatology

## 2015-03-30 DIAGNOSIS — M810 Age-related osteoporosis without current pathological fracture: Secondary | ICD-10-CM

## 2015-04-02 ENCOUNTER — Ambulatory Visit
Admission: RE | Admit: 2015-04-02 | Discharge: 2015-04-02 | Disposition: A | Discharge status: Home / Self Care | Payer: Medicare Other | Source: Ambulatory Visit | Attending: Rheumatology | Admitting: Rheumatology

## 2015-04-02 DIAGNOSIS — M810 Age-related osteoporosis without current pathological fracture: Secondary | ICD-10-CM

## 2015-04-15 DIAGNOSIS — M316 Other giant cell arteritis: Secondary | ICD-10-CM | POA: Diagnosis not present

## 2015-04-15 DIAGNOSIS — M810 Age-related osteoporosis without current pathological fracture: Secondary | ICD-10-CM | POA: Diagnosis not present

## 2015-04-15 DIAGNOSIS — R51 Headache: Secondary | ICD-10-CM | POA: Diagnosis not present

## 2015-04-15 DIAGNOSIS — R6884 Jaw pain: Secondary | ICD-10-CM | POA: Diagnosis not present

## 2015-04-15 DIAGNOSIS — R5383 Other fatigue: Secondary | ICD-10-CM | POA: Diagnosis not present

## 2015-05-13 DIAGNOSIS — Z7952 Long term (current) use of systemic steroids: Secondary | ICD-10-CM | POA: Diagnosis not present

## 2015-05-13 DIAGNOSIS — R51 Headache: Secondary | ICD-10-CM | POA: Diagnosis not present

## 2015-05-13 DIAGNOSIS — M810 Age-related osteoporosis without current pathological fracture: Secondary | ICD-10-CM | POA: Diagnosis not present

## 2015-05-13 DIAGNOSIS — R6884 Jaw pain: Secondary | ICD-10-CM | POA: Diagnosis not present

## 2015-05-13 DIAGNOSIS — M316 Other giant cell arteritis: Secondary | ICD-10-CM | POA: Diagnosis not present

## 2015-06-03 DIAGNOSIS — H52223 Regular astigmatism, bilateral: Secondary | ICD-10-CM | POA: Diagnosis not present

## 2015-06-10 DIAGNOSIS — Z7952 Long term (current) use of systemic steroids: Secondary | ICD-10-CM | POA: Diagnosis not present

## 2015-06-10 DIAGNOSIS — R51 Headache: Secondary | ICD-10-CM | POA: Diagnosis not present

## 2015-06-10 DIAGNOSIS — M810 Age-related osteoporosis without current pathological fracture: Secondary | ICD-10-CM | POA: Diagnosis not present

## 2015-06-10 DIAGNOSIS — R5383 Other fatigue: Secondary | ICD-10-CM | POA: Diagnosis not present

## 2015-06-10 DIAGNOSIS — M316 Other giant cell arteritis: Secondary | ICD-10-CM | POA: Diagnosis not present

## 2015-08-12 DIAGNOSIS — M316 Other giant cell arteritis: Secondary | ICD-10-CM | POA: Diagnosis not present

## 2015-08-12 DIAGNOSIS — R5383 Other fatigue: Secondary | ICD-10-CM | POA: Diagnosis not present

## 2015-08-12 DIAGNOSIS — Z7952 Long term (current) use of systemic steroids: Secondary | ICD-10-CM | POA: Diagnosis not present

## 2015-08-12 DIAGNOSIS — R6884 Jaw pain: Secondary | ICD-10-CM | POA: Diagnosis not present

## 2015-08-17 DIAGNOSIS — M1812 Unilateral primary osteoarthritis of first carpometacarpal joint, left hand: Secondary | ICD-10-CM | POA: Diagnosis not present

## 2015-08-17 DIAGNOSIS — M180 Bilateral primary osteoarthritis of first carpometacarpal joints: Secondary | ICD-10-CM | POA: Insufficient documentation

## 2015-10-14 DIAGNOSIS — R5383 Other fatigue: Secondary | ICD-10-CM | POA: Diagnosis not present

## 2015-10-14 DIAGNOSIS — M316 Other giant cell arteritis: Secondary | ICD-10-CM | POA: Diagnosis not present

## 2015-10-14 DIAGNOSIS — R6884 Jaw pain: Secondary | ICD-10-CM | POA: Diagnosis not present

## 2015-10-14 DIAGNOSIS — Z7952 Long term (current) use of systemic steroids: Secondary | ICD-10-CM | POA: Diagnosis not present

## 2016-01-01 ENCOUNTER — Encounter: Payer: Self-pay | Admitting: Internal Medicine

## 2016-01-01 ENCOUNTER — Ambulatory Visit (INDEPENDENT_AMBULATORY_CARE_PROVIDER_SITE_OTHER): Payer: Medicare Other | Admitting: Internal Medicine

## 2016-01-01 VITALS — BP 164/94 | HR 76 | Temp 97.9°F | Resp 16 | Ht 63.0 in | Wt 156.0 lb

## 2016-01-01 DIAGNOSIS — H9222 Otorrhagia, left ear: Secondary | ICD-10-CM

## 2016-01-01 DIAGNOSIS — H9191 Unspecified hearing loss, right ear: Secondary | ICD-10-CM | POA: Diagnosis not present

## 2016-01-01 NOTE — Assessment & Plan Note (Signed)
As a result of the ear lavage She denies any pain, change in hearing or discomfort Cottonball placed in year and she will leave that in tonight If she has any symptoms over the weekend or Monday asked her to return to clinic so I can look in the ear

## 2016-01-01 NOTE — Assessment & Plan Note (Signed)
Related to obstruction and excessive cerumen Successfully cleaned out with ear lavage and hearing was restored Discussed home use of 50% warm water and 50% hydrogen peroxide in the future to help clean out the earwax

## 2016-01-01 NOTE — Progress Notes (Signed)
Pre visit review using our clinic review tool, if applicable. No additional management support is needed unless otherwise documented below in the visit note. 

## 2016-01-01 NOTE — Patient Instructions (Addendum)
Your ears were cleaned out today.    There is some bleeding in the left ear.  Keep a cotton ball in the ear tonight. If you have pain, change in your hearing or if you continue to see blood please come back on Monday so we can look in the ear.  In the future you can use warm water and hydrogen peroxide to clean out the wax in your ears.

## 2016-01-01 NOTE — Progress Notes (Signed)
Subjective:    Patient ID: Barbara [name], female    DOB: [birth year], 82 y.o.   MRN: [id number]  HPI She is here for an acute visit.  Decreased hearing right ear: Recently she has noticed that she is not able to hear as well out of the right ear. She believes her left ear was compensating and she did not realize this initially, but it has gotten worse. She denies any pain in the right ear, decreased hearing left ear, pain in the left ear, cold symptoms including nasal congestion, sore throat or fever. She has not had any headaches, lightheadedness or dizziness. She denies any history of excessive wax problems.  Medications and allergies reviewed with patient and updated if appropriate.  Patient Active Problem List   Diagnosis Date Noted  . Elevated blood pressure 03/18/2015  . Temporal arteritis (HCC) 01/05/2015  . Aneurysm, ophthalmic artery 12/11/2014    Current Outpatient Prescriptions on File Prior to Visit  Medication Sig Dispense Refill  . Calcium-Vitamin D-Vitamin K 500-500-40 MG-UNT-MCG CHEW Chew by mouth.    . fish oil-omega-3 fatty acids 1000 MG capsule Take 2 g by mouth daily.    . Methylsulfonylmethane (MSM PO) Take by mouth.    . NON FORMULARY Slimfast powder    . predniSONE (DELTASONE) 20 MG tablet Take 2 tablets (40 mg total) by mouth daily with breakfast. 60 tablet 0   No current facility-administered medications on file prior to visit.     Past Medical History:  Diagnosis Date  . Aneurysm, ophthalmic artery   . Colonic polyp   . Diverticulosis   . Hypertension   . Urine incontinence     Past Surgical History:  Procedure Laterality Date  . disk removal      Social History   Social History  . Marital status: Married    Spouse name: N/A  . Number of children: N/A  . Years of education: N/A   Social History Main Topics  . Smoking status: Former Games developermoker  . Smokeless tobacco: Not on file  . Alcohol use No  . Drug use: No  . Sexual activity: Not on  file   Other Topics Concern  . Not on file   Social History Narrative  . No narrative on file    No family history on file.  Review of Systems  Constitutional: Negative for fever.  HENT: Negative for ear pain and sore throat.   Respiratory: Negative for cough, shortness of breath and wheezing.   Neurological: Negative for dizziness, light-headedness and headaches.       Objective:   Vitals:   01/01/16 1548  BP: (!) 164/94  Pulse: 76  Resp: 16  Temp: 97.9 F (36.6 C)   Filed Weights   01/01/16 1548  Weight: 156 lb (70.8 kg)   Body mass index is 27.63 kg/m.   Physical Exam  Constitutional: She appears well-developed and well-nourished. No distress.  HENT:  Head: Normocephalic and atraumatic.  Preprocedure: Right ear canal with excessive cerumen that is obstructing the ear canal, tympanic membrane unable to be visualized. Left ear canal with moderate cerumen, partially visualized tympanic membrane normal  Patient verbally consented to procedure. CMA used ear lavage in both ear canals.  Post procedure: Right ear canal with mild irritation and skin areas of bleeding.scant cerumen remaining. Tympanic membrane normal. Hearing has been restored. Left ear canal with moderate blood, tympanic membrane unable to be visualized, remainder of the ear canal normal. She denies complaints of pain,  decreased hearing or pressure   Skin: Skin is warm and dry. She is not diaphoretic.          Assessment & Plan:   See Problem List for Assessment and Plan of chronic medical problems.

## 2016-01-14 DIAGNOSIS — M316 Other giant cell arteritis: Secondary | ICD-10-CM | POA: Diagnosis not present

## 2016-01-14 DIAGNOSIS — R51 Headache: Secondary | ICD-10-CM | POA: Diagnosis not present

## 2016-01-14 DIAGNOSIS — M810 Age-related osteoporosis without current pathological fracture: Secondary | ICD-10-CM | POA: Diagnosis not present

## 2016-01-14 DIAGNOSIS — R6884 Jaw pain: Secondary | ICD-10-CM | POA: Diagnosis not present

## 2016-01-14 DIAGNOSIS — Z7952 Long term (current) use of systemic steroids: Secondary | ICD-10-CM | POA: Diagnosis not present

## 2016-04-14 DIAGNOSIS — R6884 Jaw pain: Secondary | ICD-10-CM | POA: Diagnosis not present

## 2016-04-14 DIAGNOSIS — R51 Headache: Secondary | ICD-10-CM | POA: Diagnosis not present

## 2016-04-14 DIAGNOSIS — M316 Other giant cell arteritis: Secondary | ICD-10-CM | POA: Diagnosis not present

## 2016-04-14 DIAGNOSIS — Z7952 Long term (current) use of systemic steroids: Secondary | ICD-10-CM | POA: Diagnosis not present

## 2016-05-04 DIAGNOSIS — M316 Other giant cell arteritis: Secondary | ICD-10-CM | POA: Diagnosis not present

## 2016-05-04 DIAGNOSIS — Z7952 Long term (current) use of systemic steroids: Secondary | ICD-10-CM | POA: Diagnosis not present

## 2016-05-13 ENCOUNTER — Ambulatory Visit (INDEPENDENT_AMBULATORY_CARE_PROVIDER_SITE_OTHER): Payer: Medicare Other | Admitting: Family Medicine

## 2016-05-13 ENCOUNTER — Encounter: Payer: Self-pay | Admitting: Family Medicine

## 2016-05-13 ENCOUNTER — Ambulatory Visit: Payer: Medicare Other | Admitting: Family

## 2016-05-13 ENCOUNTER — Ambulatory Visit (INDEPENDENT_AMBULATORY_CARE_PROVIDER_SITE_OTHER)
Admission: RE | Admit: 2016-05-13 | Discharge: 2016-05-13 | Disposition: A | Discharge status: Home / Self Care | Payer: Medicare Other | Source: Ambulatory Visit | Attending: Family Medicine | Admitting: Family Medicine

## 2016-05-13 VITALS — BP 132/84 | HR 78 | Temp 98.4°F | Resp 12 | Wt 151.8 lb

## 2016-05-13 DIAGNOSIS — J989 Respiratory disorder, unspecified: Secondary | ICD-10-CM

## 2016-05-13 DIAGNOSIS — R0989 Other specified symptoms and signs involving the circulatory and respiratory systems: Secondary | ICD-10-CM

## 2016-05-13 DIAGNOSIS — R05 Cough: Secondary | ICD-10-CM

## 2016-05-13 DIAGNOSIS — R059 Cough, unspecified: Secondary | ICD-10-CM

## 2016-05-13 DIAGNOSIS — J988 Other specified respiratory disorders: Secondary | ICD-10-CM

## 2016-05-13 MED ORDER — BENZONATATE 100 MG PO CAPS: 200.0000 mg | ORAL_CAPSULE | Freq: Two times a day (BID) | ORAL | 0 refills | Status: AC | PRN

## 2016-05-13 MED ORDER — DOXYCYCLINE HYCLATE 100 MG PO TABS: 100.0000 mg | ORAL_TABLET | Freq: Two times a day (BID) | ORAL | 0 refills | Status: DC

## 2016-05-13 MED ORDER — ALBUTEROL SULFATE HFA 108 (90 BASE) MCG/ACT IN AERS: 2.0000 | INHALATION_SPRAY | Freq: Four times a day (QID) | RESPIRATORY_TRACT | 0 refills | Status: DC | PRN

## 2016-05-13 NOTE — Patient Instructions (Signed)
  Ms.Mercy Kohen I have seen you today for an acute visit.  A few things to remember from today's visit:   Cough - Plan: DG Chest 2 View  Reactive airway disease that is not asthma - Plan: DG Chest 2 View  Doxycycline with food.   Albuterol inh 2 puff every 6 hours for a week then as needed for wheezing or shortness of breath.  Today X ray was ordered.  This [facility] done at [facility] between 8 am and 5 pm: [address] [phone].       Medications prescribed today are intended for short period of time and will not be refill upon request, a follow up appointment might be necessary to discuss continuation of of treatment if appropriate.     In general please monitor for signs of worsening symptoms and seek immediate medical attention if any concerning.  Please be sure you have an appointment already scheduled with your PCP before you leave today.

## 2016-05-13 NOTE — Progress Notes (Signed)
HPI:  ACUTE VISIT  Chief Complaint  Patient presents with  . Cough    coughing yello phlegm,fatigue and vomiting    Ms.Barbara [name] is a 83 y.o.female here today with her daughter complaining of 8-9 days of respiratory symptoms.  Productive cough with little amount of sputum, yellowish/tanned, denies hemoptysis. Symptoms started on 05/04/16 with "thraoat tickling", "a lot of coughing",some nasal congestion and rhinorrhea. She has some muscle aching, mainly on back and lower chest,attributes to cough. Cough keeps her from sleep.  + Fever for the past week or so, 100.1 F max,last night 99 F.  She has not noted exertional chest pain, dyspnea, or wheezing.  No Hx of recent travel. Some people and family members have been sick. No known insect bite.  Hx of allergies: No.  Former smoker.  OTC medications for this problem: Mucinex.  Symptoms otherwise stable.  Hx of temporal arteritis,just completed Prednisone taper. Denies Hx of GERD or heartburn. + Occasional nausea caused by coughing spells, no vomiting or diarrhea.    Review of Systems  Constitutional: Positive for appetite change and fatigue. Negative for activity change and fever.  HENT: Positive for congestion and postnasal drip. Negative for mouth sores, sinus pressure, sore throat, trouble swallowing and voice change.   Eyes: Negative for discharge and redness.  Respiratory: Positive for cough. Negative for shortness of breath and wheezing.   Cardiovascular: Negative for chest pain and palpitations.  Gastrointestinal: Negative for abdominal pain, diarrhea and vomiting.  Musculoskeletal: Positive for back pain and myalgias. Negative for neck pain.  Skin: Negative for rash.  Allergic/Immunologic: Negative for environmental allergies.  Neurological: Negative for syncope, weakness and headaches.  Hematological: Negative for adenopathy. Does not bruise/bleed easily.  Psychiatric/Behavioral: Positive for sleep  disturbance. Negative for confusion.      Current Outpatient Prescriptions on File Prior to Visit  Medication Sig Dispense Refill  . Calcium-Vitamin D-Vitamin K 500-500-40 MG-UNT-MCG CHEW Chew by mouth.    . fish oil-omega-3 fatty acids 1000 MG capsule Take 2 g by mouth daily.    . Methylsulfonylmethane (MSM PO) Take by mouth.    . NON FORMULARY Slimfast powder     No current facility-administered medications on file prior to visit.      Past Medical History:  Diagnosis Date  . Aneurysm, ophthalmic artery   . Colonic polyp   . Diverticulosis   . Hypertension   . Urine incontinence    No Known Allergies  Social History   Social History  . Marital status: Married    Spouse name: N/A  . Number of children: N/A  . Years of education: N/A   Social History Main Topics  . Smoking status: Former Games developer  . Smokeless tobacco: Former Neurosurgeon  . Alcohol use No  . Drug use: No  . Sexual activity: Not Asked   Other Topics Concern  . None   Social History Narrative  . None    Vitals:   05/13/16 1043  BP: 132/84  Pulse: 78  Resp: 12  Temp: 98.4 F (36.9 C)  O2 sat at RA 97% Body mass index is 26.89 kg/m.   Physical Exam  Nursing note and vitals reviewed. Constitutional: She is oriented to person, place, and time. She appears well-developed. She does not appear ill. No distress.  HENT:  Head: Atraumatic.  Nose: Rhinorrhea present. Right sinus exhibits no maxillary sinus tenderness and no frontal sinus tenderness. Left sinus exhibits no maxillary sinus tenderness and no frontal  sinus tenderness.  Mouth/Throat: Oropharynx is clear and moist and mucous membranes are normal.  Eyes: Conjunctivae and EOM are normal.  Neck: No muscular tenderness present. No edema and no erythema present.  Cardiovascular: Normal rate and regular rhythm.   No murmur heard. Respiratory: Effort normal. No stridor. No respiratory distress. She has wheezes (with forced expiration). She has no  rales.  Lymphadenopathy:       Head (right side): No submandibular adenopathy present.       Head (left side): No submandibular adenopathy present.    She has cervical adenopathy.       Right cervical: Posterior cervical (1 cm, mobile,no tender) adenopathy present.  Neurological: She is alert and oriented to person, place, and time. She has normal strength. Coordination and gait normal.  Skin: Skin is warm. No rash noted. No erythema.  Psychiatric: Her speech is normal. Her mood appears anxious.  Well groomed, good eye contact.      ASSESSMENT AND PLAN:     Kimbria was seen today for cough.  Diagnoses and all orders for this visit:  Cough  We discussed possible causes. CXR ordered today. Explained that after URI cough and congestion can last a few weeks.  Further recommendations will be given according to imaging results.   -     DG Chest 2 View; Future -     benzonatate (TESSALON) 100 MG capsule; Take 2 capsules (200 mg total) by mouth 2 (two) times daily as needed.  Reactive airway disease that is not asthma  Mild wheezing, non productive cough a few times during OV. She is not interested in Duoneb neb treatment in the office. Albuterol inh 2 puff every 6 hours for a week then as needed for wheezing or shortness of breath.  For now I do not think oral steroids are needed. Instructed about warning signs.  -     albuterol (PROVENTIL HFA;VENTOLIN HFA) 108 (90 Base) MCG/ACT inhaler; Inhale 2 puffs into the lungs every 6 (six) hours as needed for wheezing or shortness of breath. -     DG Chest 2 View; Future  Respiratory tract infection  Because reporting new onset of fever abx treatment was recommended.Some side effects discussed. Lung auscultation is not suggestive on pneumonia. Follow with PCP in 7-10 days,before if needed.  -     doxycycline (VIBRA-TABS) 100 MG tablet; Take 1 tablet (100 mg total) by mouth 2 (two) times daily.     -[name] was  advised to return or notify a doctor immediately if symptoms worsen or new concerns arise.       Barbara G. Swazil[place]Jordan, MD  [facility]. [place] office.

## 2016-06-22 ENCOUNTER — Other Ambulatory Visit (INDEPENDENT_AMBULATORY_CARE_PROVIDER_SITE_OTHER): Payer: Medicare Other

## 2016-06-22 ENCOUNTER — Ambulatory Visit (INDEPENDENT_AMBULATORY_CARE_PROVIDER_SITE_OTHER): Payer: Medicare Other | Admitting: Internal Medicine

## 2016-06-22 ENCOUNTER — Encounter: Payer: Self-pay | Admitting: Internal Medicine

## 2016-06-22 VITALS — BP 134/86 | HR 88 | Temp 97.7°F | Resp 16 | Wt 150.0 lb

## 2016-06-22 DIAGNOSIS — R739 Hyperglycemia, unspecified: Secondary | ICD-10-CM | POA: Diagnosis not present

## 2016-06-22 DIAGNOSIS — M810 Age-related osteoporosis without current pathological fracture: Secondary | ICD-10-CM | POA: Diagnosis not present

## 2016-06-22 DIAGNOSIS — M316 Other giant cell arteritis: Secondary | ICD-10-CM

## 2016-06-22 DIAGNOSIS — R7303 Prediabetes: Secondary | ICD-10-CM

## 2016-06-22 DIAGNOSIS — Z23 Encounter for immunization: Secondary | ICD-10-CM | POA: Diagnosis not present

## 2016-06-22 DIAGNOSIS — I700 Atherosclerosis of aorta: Secondary | ICD-10-CM

## 2016-06-22 LAB — CBC WITH DIFFERENTIAL/PLATELET
BASOS PCT: 1 % (ref 0.0–3.0)
Basophils Absolute: 0.1 10*3/uL (ref 0.0–0.1)
EOS ABS: 0.2 10*3/uL (ref 0.0–0.7)
EOS PCT: 4 % (ref 0.0–5.0)
HCT: 38.7 % (ref 36.0–46.0)
HEMOGLOBIN: 12.6 g/dL (ref 12.0–15.0)
LYMPHS ABS: 1.3 10*3/uL (ref 0.7–4.0)
Lymphocytes Relative: 20.9 % (ref 12.0–46.0)
MCHC: 32.6 g/dL (ref 30.0–36.0)
MCV: 87.9 fl (ref 78.0–100.0)
MONO ABS: 0.6 10*3/uL (ref 0.1–1.0)
Monocytes Relative: 8.9 % (ref 3.0–12.0)
NEUTROS ABS: 4.1 10*3/uL (ref 1.4–7.7)
NEUTROS PCT: 65.2 % (ref 43.0–77.0)
PLATELETS: 225 10*3/uL (ref 150.0–400.0)
RBC: 4.4 Mil/uL (ref 3.87–5.11)
RDW: 15.1 % (ref 11.5–15.5)
WBC: 6.2 10*3/uL (ref 4.0–10.5)

## 2016-06-22 LAB — COMPREHENSIVE METABOLIC PANEL WITH GFR
ALT: 10 U/L (ref 0–35)
AST: 19 U/L (ref 0–37)
Albumin: 3.9 g/dL (ref 3.5–5.2)
Alkaline Phosphatase: 87 U/L (ref 39–117)
BUN: 22 mg/dL (ref 6–23)
CO2: 28 meq/L (ref 19–32)
Calcium: 10.4 mg/dL (ref 8.4–10.5)
Chloride: 105 meq/L (ref 96–112)
Creatinine, Ser: 1.05 mg/dL (ref 0.40–1.20)
GFR: 53.4 mL/min — ABNORMAL LOW
Glucose, Bld: 89 mg/dL (ref 70–99)
Potassium: 4.6 meq/L (ref 3.5–5.1)
Sodium: 142 meq/L (ref 135–145)
Total Bilirubin: 1 mg/dL (ref 0.2–1.2)
Total Protein: 7.7 g/dL (ref 6.0–8.3)

## 2016-06-22 LAB — LIPID PANEL
Cholesterol: 196 mg/dL (ref 0–200)
HDL: 75.7 mg/dL
LDL Cholesterol: 104 mg/dL — ABNORMAL HIGH (ref 0–99)
NonHDL: 120.7
Total CHOL/HDL Ratio: 3
Triglycerides: 86 mg/dL (ref 0.0–149.0)
VLDL: 17.2 mg/dL (ref 0.0–40.0)

## 2016-06-22 LAB — VITAMIN D 25 HYDROXY (VIT D DEFICIENCY, FRACTURES): VITD: 54.3 ng/mL (ref 30.00–100.00)

## 2016-06-22 LAB — C-REACTIVE PROTEIN: CRP: 2.5 mg/dL (ref 0.5–20.0)

## 2016-06-22 LAB — SEDIMENTATION RATE: Sed Rate: 86 mm/h — ABNORMAL HIGH (ref 0–30)

## 2016-06-22 LAB — HEMOGLOBIN A1C: Hgb A1c MFr Bld: 5.9 % (ref 4.6–6.5)

## 2016-06-22 LAB — TSH: TSH: 2.04 u[IU]/mL (ref 0.35–4.50)

## 2016-06-22 NOTE — Assessment & Plan Note (Signed)
Borderline high here today Will monitor

## 2016-06-22 NOTE — Patient Instructions (Addendum)
  Test(s) ordered today. Your results will be released to MyChart (or called to you) after review, usually within 72hours after test completion. If any changes need to be made, you will be notified at that same time.  All other Health Maintenance issues reviewed.   All recommended immunizations and age-appropriate screenings are up-to-date or discussed.  Prevnar immunization administered today.   Medications reviewed and updated.  No changes recommended at this time.   Please followup in annually

## 2016-06-22 NOTE — Progress Notes (Signed)
Subjective:    Patient ID: Barbara [name], female    DOB: 1932/05/07, 83 y.o.   MRN: [id number]  HPI The patient is here for follow up.  She had a recent CXR and has questions about it.  She had the cxr for a lower respiratory infection.  Her symptoms are much improved - she just has a dry cough.  CXR 05/13/16:  FINDINGS: In the heart size is normal. Atherosclerotic calcifications are present in the aorta. The lungs are hyperinflated. No focal airspace disease is present. There is no edema or effusion. Mild degenerative changes are evident in the thoracic spine. Visualized soft tissues and bony thorax are otherwise unremarkable.  IMPRESSION: 1. No acute cardiopulmonary disease. 2. Hyperinflation suggesting COPD. 3. Aortic atherosclerosis.  She is a former smoker.     She feels she may have some element of anxiety or depression.  She feels like she gets flushed when in a small group setting.  She does not feel this at home or in a large group.  She feels it is part of depression.  She did not feel like it when she was on the prednisone.    She walks regularly.    Medications and allergies reviewed with patient and updated if appropriate.  Patient Active Problem List   Diagnosis Date Noted  . Hearing loss of right ear 01/01/2016  . Blood in ear canal, left 01/01/2016  . Elevated blood pressure 03/18/2015  . Temporal arteritis (HCC) 01/05/2015  . Aneurysm, ophthalmic artery 12/11/2014    Current Outpatient Prescriptions on File Prior to Visit  Medication Sig Dispense Refill  . albuterol (PROVENTIL HFA;VENTOLIN HFA) 108 (90 Base) MCG/ACT inhaler Inhale 2 puffs into the lungs every 6 (six) hours as needed for wheezing or shortness of breath. 1 Inhaler 0  . Calcium-Vitamin D-Vitamin K 500-500-40 MG-UNT-MCG CHEW Chew by mouth.    . fish oil-omega-3 fatty acids 1000 MG capsule Take 2 g by mouth daily.    . Methylsulfonylmethane (MSM PO) Take by mouth.    . NON FORMULARY  Slimfast powder     No current facility-administered medications on file prior to visit.     Past Medical History:  Diagnosis Date  . Aneurysm, ophthalmic artery   . Colonic polyp   . Diverticulosis   . Hypertension   . Urine incontinence     Past Surgical History:  Procedure Laterality Date  . disk removal      Social History   Social History  . Marital status: Married    Spouse name: N/A  . Number of children: N/A  . Years of education: N/A   Social History Main Topics  . Smoking status: Former Games developer  . Smokeless tobacco: Former Neurosurgeon  . Alcohol use No  . Drug use: No  . Sexual activity: Not Asked   Other Topics Concern  . None   Social History Narrative  . None    History reviewed. No pertinent family history.  Review of Systems  Constitutional: Negative for appetite change, fatigue and fever.  Respiratory: Positive for cough (dry). Negative for shortness of breath and wheezing.   Cardiovascular: Negative for chest pain, palpitations and leg swelling.  Neurological: Positive for light-headedness (sometimes). Negative for headaches.       Objective:   Vitals:   06/22/16 0902  BP: 134/86  Pulse: 88  Resp: 16  Temp: 97.7 F (36.5 C)   Wt Readings from Last 3 Encounters:  06/22/16 150 lb (68 kg)  05/13/16 151 lb 12.8 oz (68.9 kg)  01/01/16 156 lb (70.8 kg)   Body mass index is 26.57 kg/m.   Physical Exam    Constitutional: Appears well-developed and well-nourished. No distress.  HENT:  Head: Normocephalic and atraumatic.  Neck: Neck supple. No tracheal deviation present. No thyromegaly present.  No cervical lymphadenopathy Cardiovascular: Normal rate, regular rhythm and normal heart sounds.   No murmur heard. No carotid bruit .  No edema Pulmonary/Chest: Effort normal and breath sounds normal. No respiratory distress. No has no wheezes. No rales.  Skin: Skin is warm and dry. Not diaphoretic.  Psychiatric: Normal mood and affect. Behavior  is normal.     Assessment & Plan:    See Problem List for Assessment and Plan of chronic medical problems.

## 2016-06-22 NOTE — Progress Notes (Signed)
Pre visit review using our clinic review tool, if applicable. No additional management support is needed unless otherwise documented below in the visit note. 

## 2016-06-22 NOTE — Assessment & Plan Note (Addendum)
Check cbc, esr, crp Has follow up with Dr Trudie Reed No current symptoms

## 2016-06-22 NOTE — Assessment & Plan Note (Signed)
Check vitamin D level 

## 2016-06-22 NOTE — Addendum Note (Signed)
Addended by: Zenovia Jordan B on: 06/22/2016 10:17 AM   Modules accepted: Orders

## 2016-06-22 NOTE — Assessment & Plan Note (Signed)
Check cmp, lipid, tsh

## 2016-06-22 NOTE — Assessment & Plan Note (Signed)
Was on prednisone long term for temporal arteritis - currently off Did have mildly elevated sugars while on prednisone Check a1c

## 2016-06-24 ENCOUNTER — Encounter: Payer: Self-pay | Admitting: Internal Medicine

## 2016-06-25 ENCOUNTER — Encounter: Payer: Self-pay | Admitting: Internal Medicine

## 2016-06-25 DIAGNOSIS — R7303 Prediabetes: Secondary | ICD-10-CM | POA: Insufficient documentation

## 2016-06-27 ENCOUNTER — Telehealth: Payer: Self-pay | Admitting: Internal Medicine

## 2016-06-27 NOTE — Telephone Encounter (Signed)
States [facility] rheumatology is requesting recent labs that Dr. Lawerance Bach has ordered.  Would like to be faxed to [phone].

## 2016-06-28 DIAGNOSIS — Z7952 Long term (current) use of systemic steroids: Secondary | ICD-10-CM | POA: Diagnosis not present

## 2016-06-28 DIAGNOSIS — M316 Other giant cell arteritis: Secondary | ICD-10-CM | POA: Diagnosis not present

## 2016-06-28 DIAGNOSIS — R51 Headache: Secondary | ICD-10-CM | POA: Diagnosis not present

## 2016-06-28 NOTE — Telephone Encounter (Signed)
Labs faxed to [facility]

## 2016-07-13 DIAGNOSIS — E669 Obesity, unspecified: Secondary | ICD-10-CM | POA: Diagnosis not present

## 2016-07-13 DIAGNOSIS — M316 Other giant cell arteritis: Secondary | ICD-10-CM | POA: Diagnosis not present

## 2016-07-13 DIAGNOSIS — R51 Headache: Secondary | ICD-10-CM | POA: Diagnosis not present

## 2016-07-13 DIAGNOSIS — Z6827 Body mass index (BMI) 27.0-27.9, adult: Secondary | ICD-10-CM | POA: Diagnosis not present

## 2016-08-11 DIAGNOSIS — H1131 Conjunctival hemorrhage, right eye: Secondary | ICD-10-CM | POA: Diagnosis not present

## 2016-08-29 DIAGNOSIS — M1812 Unilateral primary osteoarthritis of first carpometacarpal joint, left hand: Secondary | ICD-10-CM | POA: Diagnosis not present

## 2016-08-29 DIAGNOSIS — M79644 Pain in right finger(s): Secondary | ICD-10-CM | POA: Diagnosis not present

## 2016-10-13 DIAGNOSIS — R51 Headache: Secondary | ICD-10-CM | POA: Diagnosis not present

## 2016-10-13 DIAGNOSIS — Z6827 Body mass index (BMI) 27.0-27.9, adult: Secondary | ICD-10-CM | POA: Diagnosis not present

## 2016-10-13 DIAGNOSIS — M316 Other giant cell arteritis: Secondary | ICD-10-CM | POA: Diagnosis not present

## 2016-10-13 DIAGNOSIS — E663 Overweight: Secondary | ICD-10-CM | POA: Diagnosis not present

## 2016-11-30 DIAGNOSIS — M1812 Unilateral primary osteoarthritis of first carpometacarpal joint, left hand: Secondary | ICD-10-CM | POA: Diagnosis not present

## 2016-12-05 ENCOUNTER — Telehealth: Payer: Self-pay | Admitting: Internal Medicine

## 2016-12-05 NOTE — Telephone Encounter (Signed)
[facility] - Client TELEPHONE ADVICE RECORD [facility]  Patient Name: Barbara [name]  DOB: 27-Aug-1932    Initial Comment Caller has a fluttering in her heart, It's more noticeable at times. No pattern    Nurse Assessment  Nurse: Laural Benes, RN, Dondra Spry Date/Time Lamount Cohen Time): 12/05/2016 8:37:31 AM  Confirm and document reason for call. If symptomatic, describe symptoms. ---[name] states she has a fluttering and is present during the day light/evening hours none at night no set pattern with it comes on lasts approximately 2 - 3 minutes; onset 4 weeks  Does the patient have any new or worsening symptoms? ---Yes  Will a triage be completed? ---Yes  Related visit to physician within the last 2 weeks? ---No  Does the PT have any chronic conditions? (i.e. diabetes, asthma, etc.) ---No  Is this a behavioral health or substance abuse call? ---No     Guidelines    Guideline Title Affirmed Question Affirmed Notes       Final Disposition User        Comments  NOTE: appt given for 1000am 12-06-2016 with provider Cheryll Cockayne -- did not want to see anyone but her no openings today for her

## 2016-12-06 ENCOUNTER — Ambulatory Visit (INDEPENDENT_AMBULATORY_CARE_PROVIDER_SITE_OTHER): Payer: Medicare Other | Admitting: Internal Medicine

## 2016-12-06 ENCOUNTER — Encounter: Payer: Self-pay | Admitting: Internal Medicine

## 2016-12-06 VITALS — BP 138/86 | HR 79 | Temp 97.9°F | Resp 16 | Wt 147.0 lb

## 2016-12-06 DIAGNOSIS — I498 Other specified cardiac arrhythmias: Secondary | ICD-10-CM

## 2016-12-06 NOTE — Patient Instructions (Addendum)
A treadmill stress test was ordered - they will call you to schedule this.      No immunizations administered today.   Medications reviewed and updated.  No changes recommended at this time.  Call if your symptoms change or continue.

## 2016-12-06 NOTE — Assessment & Plan Note (Addendum)
Started 4-5 weeks ago, intermittent and on other associated symptoms EKG normal -- NSR at 72 bmp,  Negative precordial T-waves.  Normal EKG.  No prior for comparison.  Will check stress test Blood work earlier this year normal - will hold off on rechecking for now If symptoms persist will consider cardiology referral

## 2016-12-06 NOTE — Progress Notes (Signed)
Subjective:    Patient ID: Barbara [name], female    DOB: 11-Jul-1932, 83 y.o.   MRN: [id number]  HPI She is here for an acute visit.   Flutters in chest:  It started about 4-5 weeks ago.  She will feel it daily.  She feels it only when she is sitting down.  It feels like a flip-flop, but does not feel fast.  It is brief, maybe 30 seconds.  She is unsure if it occurs when she is active.  She denies any associated chest pain, SOB and lightheadedness.  She denies changes in medications/supplements or increase in caffeine intake.    She is walking in the morning for exercise - she walks 3 miles.  She denies any symptoms during her exercise.  She does feel a little more tired with activity.  She is unsure if that is normal for her age or not  Medications and allergies reviewed with patient and updated if appropriate.  Patient Active Problem List   Diagnosis Date Noted  . Prediabetes 06/25/2016  . Aortic atherosclerosis (HCC) 06/22/2016  . Hyperglycemia 06/22/2016  . Osteoporosis 06/22/2016  . Primary osteoarthritis of first carpometacarpal joint of left hand 08/17/2015  . Elevated blood pressure 03/18/2015  . Temporal arteritis (HCC) 01/05/2015  . Aneurysm, ophthalmic artery 12/11/2014    Current Outpatient Prescriptions on File Prior to Visit  Medication Sig Dispense Refill  . albuterol (PROVENTIL HFA;VENTOLIN HFA) 108 (90 Base) MCG/ACT inhaler Inhale 2 puffs into the lungs every 6 (six) hours as needed for wheezing or shortness of breath. 1 Inhaler 0  . Calcium-Vitamin D-Vitamin K 500-500-40 MG-UNT-MCG CHEW Chew by mouth.    . fish oil-omega-3 fatty acids 1000 MG capsule Take 2 g by mouth daily.    . Methylsulfonylmethane (MSM PO) Take by mouth.    . NON FORMULARY Slimfast powder     No current facility-administered medications on file prior to visit.     Past Medical History:  Diagnosis Date  . Aneurysm, ophthalmic artery   . Colonic polyp   . Diverticulosis   . Hypertension    . Urine incontinence     Past Surgical History:  Procedure Laterality Date  . disk removal      Social History   Social History  . Marital status: Married    Spouse name: N/A  . Number of children: N/A  . Years of education: N/A   Social History Main Topics  . Smoking status: Former Games developer  . Smokeless tobacco: Former Neurosurgeon  . Alcohol use No  . Drug use: No  . Sexual activity: Not on file   Other Topics Concern  . Not on file   Social History Narrative  . No narrative on file    No family history on file.  Review of Systems  Constitutional: Negative for chills and fever.  Respiratory: Positive for cough (occasional). Negative for shortness of breath and wheezing.   Cardiovascular: Positive for palpitations. Negative for chest pain and leg swelling.  Neurological: Negative for light-headedness and headaches.       Objective:   Vitals:   12/06/16 0956  BP: 138/86  Pulse: 79  Resp: 16  Temp: 97.9 F (36.6 C)  SpO2: 93%   Filed Weights   12/06/16 0956  Weight: 147 lb (66.7 kg)   Body mass index is 26.04 kg/m.  Wt Readings from Last 3 Encounters:  12/06/16 147 lb (66.7 kg)  06/22/16 150 lb (68 kg)  05/13/16 151 lb  12.8 oz (68.9 kg)     Physical Exam Constitutional: Appears well-developed and well-nourished. No distress.  HENT:  Head: Normocephalic and atraumatic.  Neck: Neck supple. No tracheal deviation present. No thyromegaly present.  No cervical lymphadenopathy Cardiovascular: Normal rate, regular rhythm and normal heart sounds.   No murmur heard. No carotid bruit .  No edema Pulmonary/Chest: Effort normal and breath sounds normal. No respiratory distress. No has no wheezes. No rales.  Skin: Skin is warm and dry. Not diaphoretic.  Psychiatric: Normal mood and affect. Behavior is normal.       Assessment & Plan:   See Problem List for Assessment and Plan of chronic medical problems.

## 2016-12-10 ENCOUNTER — Other Ambulatory Visit: Payer: Self-pay | Admitting: Internal Medicine

## 2016-12-10 DIAGNOSIS — I498 Other specified cardiac arrhythmias: Secondary | ICD-10-CM

## 2016-12-26 ENCOUNTER — Ambulatory Visit (HOSPITAL_COMMUNITY): Payer: Medicare Other | Attending: Cardiovascular Disease

## 2016-12-26 ENCOUNTER — Other Ambulatory Visit: Payer: Self-pay

## 2016-12-26 ENCOUNTER — Other Ambulatory Visit: Payer: Self-pay | Admitting: Internal Medicine

## 2016-12-26 ENCOUNTER — Ambulatory Visit (HOSPITAL_COMMUNITY): Payer: Medicare Other

## 2016-12-26 ENCOUNTER — Ambulatory Visit (HOSPITAL_BASED_OUTPATIENT_CLINIC_OR_DEPARTMENT_OTHER): Payer: Medicare Other

## 2016-12-26 DIAGNOSIS — I671 Cerebral aneurysm, nonruptured: Secondary | ICD-10-CM | POA: Diagnosis not present

## 2016-12-26 DIAGNOSIS — I351 Nonrheumatic aortic (valve) insufficiency: Secondary | ICD-10-CM | POA: Diagnosis not present

## 2016-12-26 DIAGNOSIS — Z87891 Personal history of nicotine dependence: Secondary | ICD-10-CM | POA: Insufficient documentation

## 2016-12-26 DIAGNOSIS — R42 Dizziness and giddiness: Secondary | ICD-10-CM | POA: Diagnosis not present

## 2016-12-26 DIAGNOSIS — I10 Essential (primary) hypertension: Secondary | ICD-10-CM | POA: Diagnosis not present

## 2016-12-26 DIAGNOSIS — I498 Other specified cardiac arrhythmias: Secondary | ICD-10-CM

## 2016-12-26 DIAGNOSIS — R002 Palpitations: Secondary | ICD-10-CM | POA: Insufficient documentation

## 2016-12-26 DIAGNOSIS — R0600 Dyspnea, unspecified: Secondary | ICD-10-CM | POA: Insufficient documentation

## 2016-12-28 ENCOUNTER — Encounter: Payer: Self-pay | Admitting: Internal Medicine

## 2017-04-20 DIAGNOSIS — R51 Headache: Secondary | ICD-10-CM | POA: Diagnosis not present

## 2017-04-20 DIAGNOSIS — Z6828 Body mass index (BMI) 28.0-28.9, adult: Secondary | ICD-10-CM | POA: Diagnosis not present

## 2017-04-20 DIAGNOSIS — E663 Overweight: Secondary | ICD-10-CM | POA: Diagnosis not present

## 2017-04-20 DIAGNOSIS — M316 Other giant cell arteritis: Secondary | ICD-10-CM | POA: Diagnosis not present

## 2017-05-03 ENCOUNTER — Other Ambulatory Visit: Payer: Self-pay | Admitting: Dermatology

## 2017-05-03 DIAGNOSIS — D2239 Melanocytic nevi of other parts of face: Secondary | ICD-10-CM | POA: Diagnosis not present

## 2017-05-03 DIAGNOSIS — L720 Epidermal cyst: Secondary | ICD-10-CM | POA: Diagnosis not present

## 2017-05-03 DIAGNOSIS — D485 Neoplasm of uncertain behavior of skin: Secondary | ICD-10-CM | POA: Diagnosis not present

## 2017-05-03 DIAGNOSIS — D229 Melanocytic nevi, unspecified: Secondary | ICD-10-CM | POA: Diagnosis not present

## 2017-05-31 DIAGNOSIS — M316 Other giant cell arteritis: Secondary | ICD-10-CM | POA: Diagnosis not present

## 2017-06-05 DIAGNOSIS — M316 Other giant cell arteritis: Secondary | ICD-10-CM | POA: Diagnosis not present

## 2017-06-05 DIAGNOSIS — M542 Cervicalgia: Secondary | ICD-10-CM | POA: Diagnosis not present

## 2017-06-05 DIAGNOSIS — M150 Primary generalized (osteo)arthritis: Secondary | ICD-10-CM | POA: Diagnosis not present

## 2017-06-05 DIAGNOSIS — Z6827 Body mass index (BMI) 27.0-27.9, adult: Secondary | ICD-10-CM | POA: Diagnosis not present

## 2017-06-12 DIAGNOSIS — H52223 Regular astigmatism, bilateral: Secondary | ICD-10-CM | POA: Diagnosis not present

## 2017-09-05 DIAGNOSIS — R6884 Jaw pain: Secondary | ICD-10-CM | POA: Diagnosis not present

## 2017-09-05 DIAGNOSIS — E663 Overweight: Secondary | ICD-10-CM | POA: Diagnosis not present

## 2017-09-05 DIAGNOSIS — M316 Other giant cell arteritis: Secondary | ICD-10-CM | POA: Diagnosis not present

## 2017-09-05 DIAGNOSIS — Z6828 Body mass index (BMI) 28.0-28.9, adult: Secondary | ICD-10-CM | POA: Diagnosis not present

## 2018-01-01 ENCOUNTER — Encounter: Payer: Self-pay | Admitting: Internal Medicine

## 2018-01-01 ENCOUNTER — Other Ambulatory Visit (INDEPENDENT_AMBULATORY_CARE_PROVIDER_SITE_OTHER): Payer: Medicare Other

## 2018-01-01 ENCOUNTER — Ambulatory Visit (INDEPENDENT_AMBULATORY_CARE_PROVIDER_SITE_OTHER): Payer: Medicare Other | Admitting: Internal Medicine

## 2018-01-01 ENCOUNTER — Ambulatory Visit: Payer: Self-pay | Admitting: *Deleted

## 2018-01-01 VITALS — BP 150/92 | HR 73 | Temp 98.0°F | Resp 16 | Ht 63.0 in | Wt 147.8 lb

## 2018-01-01 DIAGNOSIS — R42 Dizziness and giddiness: Secondary | ICD-10-CM | POA: Diagnosis not present

## 2018-01-01 DIAGNOSIS — M546 Pain in thoracic spine: Secondary | ICD-10-CM | POA: Diagnosis not present

## 2018-01-01 DIAGNOSIS — Z23 Encounter for immunization: Secondary | ICD-10-CM | POA: Diagnosis not present

## 2018-01-01 LAB — COMPREHENSIVE METABOLIC PANEL
ALBUMIN: 4.1 g/dL (ref 3.5–5.2)
ALT: 11 U/L (ref 0–35)
AST: 18 U/L (ref 0–37)
Alkaline Phosphatase: 94 U/L (ref 39–117)
BUN: 23 mg/dL (ref 6–23)
CALCIUM: 10.1 mg/dL (ref 8.4–10.5)
CHLORIDE: 103 meq/L (ref 96–112)
CO2: 29 mEq/L (ref 19–32)
CREATININE: 1.11 mg/dL (ref 0.40–1.20)
GFR: 49.9 mL/min — ABNORMAL LOW (ref >60.00)
Glucose, Bld: 94 mg/dL (ref 70–99)
POTASSIUM: 4 meq/L (ref 3.5–5.1)
Sodium: 140 mEq/L (ref 135–145)
TOTAL PROTEIN: 7.7 g/dL (ref 6.0–8.3)
Total Bilirubin: 0.9 mg/dL (ref 0.2–1.2)

## 2018-01-01 LAB — CBC WITH DIFFERENTIAL/PLATELET
BASOS PCT: 1.5 % (ref 0.0–3.0)
Basophils Absolute: 0.1 10*3/uL (ref 0.0–0.1)
EOS PCT: 5.2 % — AB (ref 0.0–5.0)
Eosinophils Absolute: 0.3 10*3/uL (ref 0.0–0.7)
HCT: 41.2 % (ref 36.0–46.0)
HEMOGLOBIN: 13.7 g/dL (ref 12.0–15.0)
LYMPHS PCT: 23.8 % (ref 12.0–46.0)
Lymphs Abs: 1.6 10*3/uL (ref 0.7–4.0)
MCHC: 33.4 g/dL (ref 30.0–36.0)
MCV: 88.4 fl (ref 78.0–100.0)
Monocytes Absolute: 0.5 10*3/uL (ref 0.1–1.0)
Monocytes Relative: 8.3 % (ref 3.0–12.0)
Neutro Abs: 4 10*3/uL (ref 1.4–7.7)
Neutrophils Relative %: 61.2 % (ref 43.0–77.0)
Platelets: 198 10*3/uL (ref 150.0–400.0)
RBC: 4.66 Mil/uL (ref 3.87–5.11)
RDW: 15 % (ref 11.5–15.5)
WBC: 6.6 10*3/uL (ref 4.0–10.5)

## 2018-01-01 NOTE — Patient Instructions (Addendum)
Have blood work done today.    Your dizziness may have been from dehydration - continue to drink plenty of fluids.  Call if your symptoms return.    Try heat or ice and tylenol for the left middle back pain.  If the pain does not go away then we should consider imaging.      Dizziness Dizziness is a common problem. It is a feeling of unsteadiness or light-headedness. You may feel like you are about to faint. Dizziness can lead to injury if you stumble or fall. Anyone can become dizzy, but dizziness is more common in older adults. This condition can be caused by a number of things, including medicines, dehydration, or illness. Follow these instructions at home: Eating and drinking  Drink enough fluid to keep your urine clear or pale yellow. This helps to keep you from becoming dehydrated. Try to drink more clear fluids, such as water.  Do not drink alcohol.  Limit your caffeine intake if told to do so by your health care provider. Check ingredients and nutrition facts to see if a food or beverage contains caffeine.  Limit your salt (sodium) intake if told to do so by your health care provider. Check ingredients and nutrition facts to see if a food or beverage contains sodium. Activity  Avoid making quick movements. ? Rise slowly from chairs and steady yourself until you feel okay. ? In the morning, first sit up on the side of the bed. When you feel okay, stand slowly while you hold onto something until you know that your balance is fine.  If you need to stand in one place for a long time, move your legs often. Tighten and relax the muscles in your legs while you are standing.  Do not drive or use heavy machinery if you feel dizzy.  Avoid bending down if you feel dizzy. Place items in your home so that they are easy for you to reach without leaning over. Lifestyle  Do not use any products that contain nicotine or tobacco, such as cigarettes and e-cigarettes. If you need help quitting,  ask your health care provider.  Try to reduce your stress level by using methods such as yoga or meditation. Talk with your health care provider if you need help to manage your stress. General instructions  Watch your dizziness for any changes.  Take over-the-counter and prescription medicines only as told by your health care provider. Talk with your health care provider if you think that your dizziness is caused by a medicine that you are taking.  Tell a friend or a family member that you are feeling dizzy. If he or she notices any changes in your behavior, have this person call your health care provider.  Keep all follow-up visits as told by your health care provider. This is important. Contact a health care provider if:  Your dizziness does not go away.  Your dizziness or light-headedness gets worse.  You feel nauseous.  You have reduced hearing.  You have new symptoms.  You are unsteady on your feet or you feel like the room is spinning. Get help right away if:  You vomit or have diarrhea and are unable to eat or drink anything.  You have problems talking, walking, swallowing, or using your arms, hands, or legs.  You feel generally weak.  You are not thinking clearly or you have trouble forming sentences. It may take a friend or family member to notice this.  You have chest pain,  abdominal pain, shortness of breath, or sweating.  Your vision changes.  You have any bleeding.  You have a severe headache.  You have neck pain or a stiff neck.  You have a fever. These symptoms may represent a serious problem that is an emergency. Do not wait to see if the symptoms will go away. Get medical help right away. Call your local emergency services (911 in the [place] Do not drive yourself to the hospital. Summary  Dizziness is a feeling of unsteadiness or light-headedness. This condition can be caused by a number of things, including medicines, dehydration, or  illness.  Anyone can become dizzy, but dizziness is more common in older adults.  Drink enough fluid to keep your urine clear or pale yellow. Do not drink alcohol.  Avoid making quick movements if you feel dizzy. Monitor your dizziness for any changes. This information is not intended to replace advice given to you by your health care provider. Make sure you discuss any questions you have with your health care provider. Document Released: 08/24/2000 Document Revised: 04/02/2016 Document Reviewed: 04/02/2016 Elsevier Interactive Patient Education  [employer].

## 2018-01-01 NOTE — Assessment & Plan Note (Signed)
?    Started about 2 months ago-she is a poor historian because she assumed this was muscular in nature and has not really paid much attention it over the last 2 months Possible muscular skeletal pain Possible kidney pain, which seems less likely.  No urinary symptoms, but given location of pain will check urinalysis, urine culture Try heat, ice and Tylenol-advised not to take any NSAIDs If no improvement over the next few days we will consider imaging

## 2018-01-01 NOTE — Progress Notes (Signed)
Subjective:    Patient ID: Barbara [name], female    DOB: 1933-02-25, 84 y.o.   MRN: [id number]  HPI The patient is here for an acute visit.  She is here with her daughter.   Saturday at 3 am she went to the bathroom. She sits on the edge of the bed whenever she gets up to go to the bathroom at night, which is what she did that night.  All of a sudden she had an overwhelming power and she fell back on the bed.  She was overcome by sudden onset of a spinning sensation.  She laid there briefly and then was able to get up and go to the bathroom.  When she came back she layed down and the room was spinning.  She has had vertigo in the past, but this did not feel that way.  She was concerned she may have been dehydrated because she does not drink much water and started drinking a lot of water.  She took it easy yesterday.  She has not had any dizziness or uneasiness since then.  She was very concerned about how quickly it came on and how extreme it was.  She also has a soreness in her left middle back that she thinks started a couple of months ago.  She is working in yard and assumed that she may have pulled something.  She thinks it has been pretty consistent since then, but is unsure if it has come and gone.  She is unsure if anything makes it worse or better.  For a while she was taking naproxen for left wrist pain, but did not pay attention if that happened to help this pain as well.  She was concerned about her kidneys because of her decreased kidney function.  She knows she needs to drink more water.  She was unsure if this pain was related to the dizziness.  She denies any urinary symptoms.    Medications and allergies reviewed with patient and updated if appropriate.  Patient Active Problem List   Diagnosis Date Noted  . Fluttering heart 12/06/2016  . Prediabetes 06/25/2016  . Aortic atherosclerosis (HCC) 06/22/2016  . Osteoporosis 06/22/2016  . Bilateral primary osteoarthritis of first  carpometacarpal joints 08/17/2015  . Elevated blood pressure 03/18/2015  . Temporal arteritis (HCC) 01/05/2015  . Aneurysm, ophthalmic artery 12/11/2014    Current Outpatient Medications on File Prior to Visit  Medication Sig Dispense Refill  . albuterol (PROVENTIL HFA;VENTOLIN HFA) 108 (90 Base) MCG/ACT inhaler Inhale 2 puffs into the lungs every 6 (six) hours as needed for wheezing or shortness of breath. 1 Inhaler 0  . Calcium-Vitamin D-Vitamin K 500-500-40 MG-UNT-MCG CHEW Chew by mouth.    . fish oil-omega-3 fatty acids 1000 MG capsule Take 2 g by mouth daily.    . Methylsulfonylmethane (MSM PO) Take by mouth.    . NON FORMULARY Slimfast powder    . Nutritional Supplements (ANTI-INFLAMMATORY ENZYME) CAPS APPLY 1 2 GMS TO AFFECTED AREA 3 4 TIMES DAILY AS NEEDED FOR PAIN  THIS IS A COMPOUNDED CREAM 1 PUMP EQUALS 1 GRAM  1   No current facility-administered medications on file prior to visit.     Past Medical History:  Diagnosis Date  . Aneurysm, ophthalmic artery   . Colonic polyp   . Diverticulosis   . Hypertension   . Urine incontinence     Past Surgical History:  Procedure Laterality Date  . disk removal  Social History   Socioeconomic History  . Marital status: Married    Spouse name: Not on file  . Number of children: Not on file  . Years of education: Not on file  . Highest education level: Not on file  Occupational History  . Not on file  Social Needs  . Financial resource strain: Not on file  . Food insecurity:    Worry: Not on file    Inability: Not on file  . Transportation needs:    Medical: Not on file    Non-medical: Not on file  Tobacco Use  . Smoking status: Former Games developer  . Smokeless tobacco: Former Engineer, water and Sexual Activity  . Alcohol use: No  . Drug use: No  . Sexual activity: Not on file  Lifestyle  . Physical activity:    Days per week: Not on file    Minutes per session: Not on file  . Stress: Not on file    Relationships  . Social connections:    Talks on phone: Not on file    Gets together: Not on file    Attends religious service: Not on file    Active member of club or organization: Not on file    Attends meetings of clubs or organizations: Not on file    Relationship status: Not on file  Other Topics Concern  . Not on file  Social History Narrative  . Not on file    No family history on file.  Review of Systems  Constitutional: Negative for fever.  HENT: Negative for congestion, ear pain and sinus pain.   Gastrointestinal: Negative for abdominal pain and nausea.  Genitourinary: Negative for dysuria and hematuria.       No urine odor  Neurological: Positive for dizziness. Negative for weakness, light-headedness and headaches.       Objective:   Vitals:   01/01/18 1027  BP: (!) 150/92  Pulse: 73  Resp: 16  Temp: 98 F (36.7 C)  SpO2: 97%   BP Readings from Last 3 Encounters:  01/01/18 (!) 150/92  12/06/16 138/86  06/22/16 134/86   Wt Readings from Last 3 Encounters:  01/01/18 147 lb 12.8 oz (67 kg)  12/06/16 147 lb (66.7 kg)  06/22/16 150 lb (68 kg)   Body mass index is 26.18 kg/m.   Physical Exam  Constitutional: She is oriented to person, place, and time. She appears well-developed and well-nourished. No distress.  HENT:  Head: Normocephalic and atraumatic.  Right Ear: External ear normal.  Left Ear: External ear normal.  Mouth/Throat: Oropharynx is clear and moist.  Moderate cerumen in right ear canal with partially visualized tympanic membrane that appears normal, minimal cerumen left ear canal with normal TM  Eyes: Pupils are equal, round, and reactive to light. Conjunctivae and EOM are normal.  Neck: Neck supple. No tracheal deviation present. No thyromegaly present.  Cardiovascular: Normal rate and regular rhythm.  Pulmonary/Chest: Effort normal and breath sounds normal.  Musculoskeletal: She exhibits no edema.  Minimal left CVA/mid back  tenderness, no obvious psoas tenderness, no thoracic spine tenderness with palpation, no increased pain with movement  Lymphadenopathy:    She has no cervical adenopathy.  Neurological: She is alert and oriented to person, place, and time. No cranial nerve deficit or sensory deficit.  Normal strength all extremities  Skin: Skin is warm and dry. She is not diaphoretic.           Assessment & Plan:    See  Problem List for Assessment and Plan of chronic medical problems.

## 2018-01-01 NOTE — Telephone Encounter (Signed)
Called in c/o having a dizzy spell early Sunday morning when she sit up on the side of the bed to go to the bathroom.   C/o her left side bothering her in the back also.    See the triage notes.  I scheduled her with Dr. Lawerance Bach for today at 10:30.    Reason for Disposition . [1] MODERATE dizziness (e.g., vertigo; feels very unsteady, interferes with normal activities) AND [2] has NOT been evaluated by physician for this  Answer Assessment - Initial Assessment Questions 1. DESCRIPTION: "Describe your dizziness."     Early Sunday morning sit on edge of bed and fell back.   I  Got to the bathroom and then back to bed the room was spinning.   Just happened that one time. My left side in the back is bothering me.    My back is sore.   It started a month and 1/2 ago.   I work outside a lot and thought I pulled a muscle. 2. VERTIGO: "Do you feel like either you or the room is spinning or tilting?"      Room spinning. 3. LIGHTHEADED: "Do you feel lightheaded?" (e.g., somewhat faint, woozy, weak upon standing)     When it happened the first time I did not pass out but I fell back on the bed. 4. SEVERITY: "How bad is it?"  "Can you walk?"   - MILD - Feels unsteady but walking normally.   - MODERATE - Feels very unsteady when walking, but not falling; interferes with normal activities (e.g., school, work) .   - SEVERE - Unable to walk without falling (requires assistance).     I was able to go to the bathroom and back after laying there a minute or two.    No dizziness since that episode.   I started drinking a lot of water because I thought maybe it was my kidney. I'm incontinent.   The urine is clear and no burning.   No fever. 5. ONSET:  "When did the dizziness begin?"     Early Sunday morning. 6. AGGRAVATING FACTORS: "Does anything make it worse?" (e.g., standing, change in head position)     Not happening now. 7. CAUSE: "What do you think is causing the dizziness?"     Maybe a kidney problem  since my back hurts.   8. RECURRENT SYMPTOM: "Have you had dizziness before?" If so, ask: "When was the last time?" "What happened that time?"     2-3 weeks ago I was in the kitchen and I was a little dizzy but it went away. 9. OTHER SYMPTOMS: "Do you have any other symptoms?" (e.g., headache, weakness, numbness, vomiting, earache)     No 10. PREGNANCY: "Is there any chance you are pregnant?" "When was your last menstrual period?"       Not asked due to age  Protocols used: DIZZINESS - VERTIGO-A-AH

## 2018-01-01 NOTE — Assessment & Plan Note (Signed)
She had sudden onset of vertigo 2 nights ago that has resolved She has not had any dizziness since then Likely dehydration, less likely BPPV  She has been drinking more water and there has been improvement Encouraged her to continue increased fluids Advised her to call if the dizziness recurs, but at this time neuro exam is nonfocal and no other concerning symptoms to warrant further evaluation

## 2018-01-02 ENCOUNTER — Other Ambulatory Visit (INDEPENDENT_AMBULATORY_CARE_PROVIDER_SITE_OTHER): Payer: Medicare Other

## 2018-01-02 DIAGNOSIS — M546 Pain in thoracic spine: Secondary | ICD-10-CM | POA: Diagnosis not present

## 2018-01-02 LAB — URINALYSIS, ROUTINE W REFLEX MICROSCOPIC
BILIRUBIN URINE: NEGATIVE
HGB URINE DIPSTICK: NEGATIVE
LEUKOCYTES UA: NEGATIVE
NITRITE: NEGATIVE
PH: 6 (ref 5.0–8.0)
RBC / HPF: NONE SEEN (ref 0–?)
Specific Gravity, Urine: 1.02 (ref 1.000–1.030)
Total Protein, Urine: NEGATIVE
Urine Glucose: NEGATIVE
Urobilinogen, UA: 0.2 (ref 0.0–1.0)

## 2018-01-03 ENCOUNTER — Other Ambulatory Visit: Payer: Self-pay | Admitting: Internal Medicine

## 2018-01-03 ENCOUNTER — Encounter: Payer: Self-pay | Admitting: Internal Medicine

## 2018-01-03 DIAGNOSIS — R944 Abnormal results of kidney function studies: Secondary | ICD-10-CM

## 2018-01-03 LAB — URINE CULTURE
MICRO NUMBER: 91268663
SPECIMEN QUALITY:: ADEQUATE

## 2018-01-04 ENCOUNTER — Encounter: Payer: Self-pay | Admitting: Internal Medicine

## 2018-01-04 DIAGNOSIS — R82998 Other abnormal findings in urine: Secondary | ICD-10-CM

## 2018-01-04 DIAGNOSIS — R944 Abnormal results of kidney function studies: Secondary | ICD-10-CM

## 2018-01-04 DIAGNOSIS — M549 Dorsalgia, unspecified: Secondary | ICD-10-CM

## 2018-01-08 ENCOUNTER — Encounter: Payer: Self-pay | Admitting: Internal Medicine

## 2018-01-09 MED ORDER — AMLODIPINE BESYLATE 5 MG PO TABS: 5.0000 mg | ORAL_TABLET | Freq: Every day | ORAL | 3 refills | Status: DC

## 2018-01-09 NOTE — Telephone Encounter (Signed)
Called pt to inform her that a new BP med was sent to pharmacy. Made a follow up appointment for next week to see how med is doing.

## 2018-01-09 NOTE — Telephone Encounter (Signed)
We need to start medication.  Start amlodipine 5 mg daily-sent to pharmacy.  Have her to monitor her blood pressure and schedule a follow-up next week.

## 2018-01-09 NOTE — Telephone Encounter (Signed)
Routing to CMA 

## 2018-01-09 NOTE — Telephone Encounter (Signed)
Pt is calling back for a follow up. Patient states her blood pressure is 174/98 today and running around 169/82 and 165/93, patient is concerned that it is running this high

## 2018-01-10 ENCOUNTER — Ambulatory Visit
Admission: RE | Admit: 2018-01-10 | Discharge: 2018-01-10 | Disposition: A | Discharge status: Home / Self Care | Payer: Medicare Other | Source: Ambulatory Visit | Attending: Internal Medicine | Admitting: Internal Medicine

## 2018-01-10 DIAGNOSIS — N200 Calculus of kidney: Secondary | ICD-10-CM | POA: Diagnosis not present

## 2018-01-10 DIAGNOSIS — R944 Abnormal results of kidney function studies: Secondary | ICD-10-CM

## 2018-01-10 DIAGNOSIS — M549 Dorsalgia, unspecified: Secondary | ICD-10-CM

## 2018-01-10 DIAGNOSIS — R82998 Other abnormal findings in urine: Secondary | ICD-10-CM

## 2018-01-17 ENCOUNTER — Ambulatory Visit: Payer: Self-pay

## 2018-01-17 DIAGNOSIS — N200 Calculus of kidney: Secondary | ICD-10-CM | POA: Insufficient documentation

## 2018-01-17 NOTE — Telephone Encounter (Signed)
Returned call to pt.  Reported she had an episode of feeling dizzy last night approx. 12:00 MN.  Stated it was "a heavy episode of head and room spinning."  Reported she did not think it lasted very long, but did not know the length of time.  Stated that she had some "fleeting episodes" of dizziness with moving her head left or right, after the initial episode at midnight.  Reported BP, following the episode, was 147/82.  Denied any vision change, headache, speech difficulty, confusion, weakness of extremities, facial droop, chest pain, or shortness of breath.  BP checked this AM; 143/82, pulse 73.  Reported had another fleeting episode of dizziness while standing this morning.(denied that this was with position change)  Has an appt. in AM with PCP.  Reported since the dizziness can be a side effect of Amlodipine, questioned if she should take it tonight?  Advised since she had the dizziness prior to starting the Amlodipine, to take her dose this PM, and discuss further with Dr. Lawerance Bach at the appt. tomorrow.  Care advice given per protocol. Pt. verb. understanding.  Agrees with plan.     Reason for Disposition . [1] MODERATE dizziness (e.g., interferes with normal activities) AND [2] has been evaluated by physician for this  Answer Assessment - Initial Assessment Questions 1. DESCRIPTION: "Describe your dizziness."     Felt like a heavy episode of head/ room spinning 2. LIGHTHEADED: "Do you feel lightheaded?" (e.g., somewhat faint, woozy, weak upon standing)     Denied feeling faint 3. VERTIGO: "Do you feel like either you or the room is spinning or tilting?" (i.e. vertigo)     Room spinning 4. SEVERITY: "How bad is it?"  "Do you feel like you are going to faint?" "Can you stand and walk?"   - MILD - walking normally   - MODERATE - interferes with normal activities (e.g., work, school)    - SEVERE - unable to stand, requires support to walk, feels like passing out now.     Seemed like a heavy  episode; it did not last very long   5. ONSET:  "When did the dizziness begin?"     During night; thought approx. 12:00 MN   6. AGGRAVATING FACTORS: "Does anything make it worse?" (e.g., standing, change in head position)     Denied aggravating factors 7. HEART RATE: "Can you tell me your heart rate?" "How many beats in 15 seconds?"  (Note: not all patients can do this)       73 8. CAUSE: "What do you think is causing the dizziness?"     Unsure; questioned if Amlodipine is contributing to it.  9. RECURRENT SYMPTOM: "Have you had dizziness before?" If so, ask: "When was the last time?" "What happened that time?"     Yes ; fell backwards onto bed with the episode; heavy spinning 10. OTHER SYMPTOMS: "Do you have any other symptoms?" (e.g., fever, chest pain, vomiting, diarrhea, bleeding)       Denied chest pain, shortness of breath, vision change,  speech change; facial droop, headache, confusion, or extremity weakness.  Reported she has hx of Temporal arteritis 11. PREGNANCY: "Is there any chance you are pregnant?" "When was your last menstrual period?"       n/a  Protocols used: DIZZINESS [facility]  Message from Crist Infante sent at 01/17/2018 12:26 PM EST   Summary: advice   Pt started amLODipine (NORVASC) 5 MG tablet on 10/29. Last night she had a  very bad dizzy spell when she got up during the night. (pt not dizzy now). Pt states she is taking this pill at night, has an appt tomorrow, but wants to know if she should take the pill tonight? Especially since it does not seem to be working.

## 2018-01-17 NOTE — Progress Notes (Signed)
Subjective:    Patient ID: Barbara [name], female    DOB: 11-21-1932, 84 y.o.   MRN: [id number]  HPI The patient is here for follow up.  Dizziness:  She was here two weeks ago for dizziness.  She had another episode two nights ago.  She had a heavy feeling in her head and spinning sensation.  It was transient.  She had mild subsequent dizziness with moving her head that night.  She denied any change in vision, headache, difficulty with speech, confusion, weakness, facial droop, CP or SOB at the time of the dizziness.  Yesterday morning she had another episode of dizziness.    Left mid back soreness:  An [place] one week ago showed tiny non-obstructing left interpolar renal stone 4mm.  She still has some mild tenderness in her left mid back with palpation, but it has improved.  Hypertension: her BP was elevated at her last visit on 10/21 and we started amlodipine 5 mg daily.  She is taking her medication daily. She is compliant with a low sodium diet.  She denies chest pain, palpitations, edema, shortness of breath and regular headaches.  She does monitor her blood pressure at home - it was 147/82 after her dizziness two nights ago, BP yesterday morning was 143/82.  Sometimes when sitting and moving her head she will get mild dizziness.    Left hip pain:  It bothers her at night. She has pain at when she first gets up and at night.  She does not have too much pain while walking.  She would like to have an x-ray taken to make sure this is not related to arthritis.  She has not been walking regularly recently-over the past 2 weeks because of the dizziness.   Medications and allergies reviewed with patient and updated if appropriate.  Patient Active Problem List   Diagnosis Date Noted  . Left hip pain 01/18/2018  . Nephrolithiasis 01/17/2018  . Dizziness 01/01/2018  . Left-sided thoracic back pain 01/01/2018  . Fluttering heart 12/06/2016  . Prediabetes 06/25/2016  . Aortic atherosclerosis (HCC)  06/22/2016  . Osteoporosis 06/22/2016  . Bilateral primary osteoarthritis of first carpometacarpal joints 08/17/2015  . Hypertension 03/18/2015  . Temporal arteritis (HCC) 01/05/2015  . Aneurysm, ophthalmic artery 12/11/2014    Current Outpatient Medications on File Prior to Visit  Medication Sig Dispense Refill  . albuterol (PROVENTIL HFA;VENTOLIN HFA) 108 (90 Base) MCG/ACT inhaler Inhale 2 puffs into the lungs every 6 (six) hours as needed for wheezing or shortness of breath. 1 Inhaler 0  . amLODipine (NORVASC) 5 MG tablet Take 1 tablet (5 mg total) by mouth daily. 30 tablet 3  . Calcium-Vitamin D-Vitamin K 500-500-40 MG-UNT-MCG CHEW Chew by mouth.    . fish oil-omega-3 fatty acids 1000 MG capsule Take 2 g by mouth daily.    . Methylsulfonylmethane (MSM PO) Take by mouth.    . NON FORMULARY Slimfast powder    . Nutritional Supplements (ANTI-INFLAMMATORY ENZYME) CAPS APPLY 1 2 GMS TO AFFECTED AREA 3 4 TIMES DAILY AS NEEDED FOR PAIN  THIS IS A COMPOUNDED CREAM 1 PUMP EQUALS 1 GRAM  1   No current facility-administered medications on file prior to visit.     Past Medical History:  Diagnosis Date  . Aneurysm, ophthalmic artery   . Colonic polyp   . Diverticulosis   . Hypertension   . Urine incontinence     Past Surgical History:  Procedure Laterality Date  . disk removal  Social History   Socioeconomic History  . Marital status: Married    Spouse name: Not on file  . Number of children: Not on file  . Years of education: Not on file  . Highest education level: Not on file  Occupational History  . Not on file  Social Needs  . Financial resource strain: Not on file  . Food insecurity:    Worry: Not on file    Inability: Not on file  . Transportation needs:    Medical: Not on file    Non-medical: Not on file  Tobacco Use  . Smoking status: Former Games developer  . Smokeless tobacco: Former Engineer, water and Sexual Activity  . Alcohol use: No  . Drug use: No  .  Sexual activity: Not on file  Lifestyle  . Physical activity:    Days per week: Not on file    Minutes per session: Not on file  . Stress: Not on file  Relationships  . Social connections:    Talks on phone: Not on file    Gets together: Not on file    Attends religious service: Not on file    Active member of club or organization: Not on file    Attends meetings of clubs or organizations: Not on file    Relationship status: Not on file  Other Topics Concern  . Not on file  Social History Narrative  . Not on file    No family history on file.  Review of Systems  Constitutional: Negative for fever.  HENT: Positive for hearing loss.   Respiratory: Negative for cough, shortness of breath and wheezing.   Cardiovascular: Negative for chest pain, palpitations and leg swelling.  Musculoskeletal: Positive for arthralgias and back pain.  Neurological: Positive for dizziness. Negative for headaches.       Objective:   Vitals:   01/18/18 0947  BP: (!) 144/78  Pulse: 75  Resp: 16  Temp: 98.5 F (36.9 C)  SpO2: 98%   BP Readings from Last 3 Encounters:  01/18/18 (!) 144/78  01/01/18 (!) 150/92  12/06/16 138/86   Wt Readings from Last 3 Encounters:  01/18/18 149 lb (67.6 kg)  01/01/18 147 lb 12.8 oz (67 kg)  12/06/16 147 lb (66.7 kg)   Body mass index is 26.39 kg/m.   Physical Exam    Constitutional: Appears well-developed and well-nourished. No distress.  HENT:  Head: Normocephalic and atraumatic.  Neck: Right ear canal obstructed with cerumen, TM not able to be visualized; left ear canal and tympanic membrane normal.  Neck supple. No tracheal deviation present. No thyromegaly present.  No cervical lymphadenopathy Cardiovascular: Normal rate, regular rhythm and normal heart sounds.   No murmur heard. No carotid bruit .  No edema Pulmonary/Chest: Effort normal and breath sounds normal. No respiratory distress. No has no wheezes. No rales.  Skin: Skin is warm and  dry. Not diaphoretic.  Psychiatric: Normal mood and affect. Behavior is normal.      Assessment & Plan:    See Problem List for Assessment and Plan of chronic medical problems.

## 2018-01-18 ENCOUNTER — Encounter: Payer: Self-pay | Admitting: Internal Medicine

## 2018-01-18 ENCOUNTER — Ambulatory Visit (INDEPENDENT_AMBULATORY_CARE_PROVIDER_SITE_OTHER)
Admission: RE | Admit: 2018-01-18 | Discharge: 2018-01-18 | Disposition: A | Discharge status: Home / Self Care | Payer: Medicare Other | Source: Ambulatory Visit | Attending: Internal Medicine | Admitting: Internal Medicine

## 2018-01-18 ENCOUNTER — Ambulatory Visit (INDEPENDENT_AMBULATORY_CARE_PROVIDER_SITE_OTHER): Payer: Medicare Other | Admitting: Internal Medicine

## 2018-01-18 VITALS — BP 144/78 | HR 75 | Temp 98.5°F | Resp 16 | Ht 63.0 in | Wt 149.0 lb

## 2018-01-18 DIAGNOSIS — M25552 Pain in left hip: Secondary | ICD-10-CM | POA: Diagnosis not present

## 2018-01-18 DIAGNOSIS — H9190 Unspecified hearing loss, unspecified ear: Secondary | ICD-10-CM | POA: Insufficient documentation

## 2018-01-18 DIAGNOSIS — I10 Essential (primary) hypertension: Secondary | ICD-10-CM | POA: Diagnosis not present

## 2018-01-18 DIAGNOSIS — N200 Calculus of kidney: Secondary | ICD-10-CM

## 2018-01-18 DIAGNOSIS — M7062 Trochanteric bursitis, left hip: Secondary | ICD-10-CM | POA: Insufficient documentation

## 2018-01-18 DIAGNOSIS — R42 Dizziness and giddiness: Secondary | ICD-10-CM

## 2018-01-18 NOTE — Assessment & Plan Note (Signed)
Left hip pain while sleeping or when she first gets up, not painful with walking We will get an x-ray, but discussed this is not necessarily arthritis could be something else Discussed seeing orthopedic or sports medicine doctor to help evaluate because of the pain-she deferred for now

## 2018-01-18 NOTE — Assessment & Plan Note (Signed)
Average blood pressure at home controlled and acceptable Continue amlodipine 5 mg daily Continue monitor at home We will get back to her usual walking

## 2018-01-18 NOTE — Assessment & Plan Note (Signed)
Small still left kidney-discussed that this most likely is not causing her pain, which may have been musculoskeletal Deferred neurology referral at this time since size is so small there is not much that can be done Increase fluids Monitor pain

## 2018-01-18 NOTE — Assessment & Plan Note (Signed)
Not new Will refer to ENT for further evaluation She does have excessive cerumen in the right ear canal, but we will not removed today because any cause increased dizziness, which she is Artie experiencing

## 2018-01-18 NOTE — Assessment & Plan Note (Signed)
Occurs with head movements Likely BPPV or labyrinthitis Deferred physical therapy She is interested in seeing ENT-referred Continue good hydration

## 2018-01-18 NOTE — Patient Instructions (Addendum)
Have an x-ray today of your left hip.   A referral was ordered for ENT.     Continue your BP medication and monitor your blood pressure at home.     Follow up in 3 months, sooner if needed

## 2018-01-31 DIAGNOSIS — R42 Dizziness and giddiness: Secondary | ICD-10-CM | POA: Diagnosis not present

## 2018-01-31 DIAGNOSIS — H6123 Impacted cerumen, bilateral: Secondary | ICD-10-CM | POA: Insufficient documentation

## 2018-01-31 DIAGNOSIS — H903 Sensorineural hearing loss, bilateral: Secondary | ICD-10-CM | POA: Insufficient documentation

## 2018-02-23 ENCOUNTER — Ambulatory Visit: Payer: Self-pay

## 2018-02-23 NOTE — Telephone Encounter (Addendum)
Spoke with pt, move pt up appt to 02/27/18.

## 2018-02-23 NOTE — Telephone Encounter (Signed)
Pt. Reports she continues to have left hip pain. Has an appointment in Jan. To see Dr. Antoine PrimasZachary Smith. Taking Tylenol twice a day and is using heat for pain relief. Does not want to take any kind of Prednisone. Wants to know if she could have a topical pain reliever if possible.Please advise pt. Answer Assessment - Initial Assessment Questions 1. LOCATION and RADIATION: "Where is the pain located?"      Left hip pain 2. QUALITY: "What does the pain feel like?"  (e.g., sharp, dull, aching, burning)     Achy pain 3. SEVERITY: "How bad is the pain?" "What does it keep you from doing?"   (Scale 1-10; or mild, moderate, severe)   -  MILD (1-3): doesn't interfere with normal activities    -  MODERATE (4-7): interferes with normal activities (e.g., work or school) or awakens from sleep, limping    -  SEVERE (8-10): excruciating pain, unable to do any normal activities, unable to walk     Constant at night sometimes 4. ONSET: "When did the pain start?" "Does it come and go, or is it there all the time?"     Started 2 months ago 5. WORK OR EXERCISE: "Has there been any recent work or exercise that involved this part of the body?"      No 6. CAUSE: "What do you think is causing the hip pain?"      Unsure 7. AGGRAVATING FACTORS: "What makes the hip pain worse?" (e.g., walking, climbing stairs, running)     Stairs and getting in and out of the car 8. OTHER SYMPTOMS: "Do you have any other symptoms?" (e.g., back pain, pain shooting down leg,  fever, rash)     No  Protocols used: HIP PAIN-A-AH

## 2018-02-28 DIAGNOSIS — Z6827 Body mass index (BMI) 27.0-27.9, adult: Secondary | ICD-10-CM | POA: Diagnosis not present

## 2018-02-28 DIAGNOSIS — M316 Other giant cell arteritis: Secondary | ICD-10-CM | POA: Diagnosis not present

## 2018-02-28 DIAGNOSIS — E663 Overweight: Secondary | ICD-10-CM | POA: Diagnosis not present

## 2018-02-28 DIAGNOSIS — R6884 Jaw pain: Secondary | ICD-10-CM | POA: Diagnosis not present

## 2018-03-01 ENCOUNTER — Ambulatory Visit: Payer: Medicare Other | Admitting: Family Medicine

## 2018-03-27 ENCOUNTER — Ambulatory Visit: Payer: Medicare Other | Admitting: Family Medicine

## 2018-04-02 DIAGNOSIS — E663 Overweight: Secondary | ICD-10-CM | POA: Diagnosis not present

## 2018-04-02 DIAGNOSIS — M5432 Sciatica, left side: Secondary | ICD-10-CM | POA: Diagnosis not present

## 2018-04-02 DIAGNOSIS — Z6827 Body mass index (BMI) 27.0-27.9, adult: Secondary | ICD-10-CM | POA: Diagnosis not present

## 2018-04-02 DIAGNOSIS — M7062 Trochanteric bursitis, left hip: Secondary | ICD-10-CM | POA: Diagnosis not present

## 2018-05-07 ENCOUNTER — Other Ambulatory Visit: Payer: Self-pay | Admitting: Internal Medicine

## 2018-05-09 ENCOUNTER — Encounter: Payer: Self-pay | Admitting: Internal Medicine

## 2018-05-09 ENCOUNTER — Other Ambulatory Visit (INDEPENDENT_AMBULATORY_CARE_PROVIDER_SITE_OTHER): Payer: Medicare Other

## 2018-05-09 ENCOUNTER — Ambulatory Visit (INDEPENDENT_AMBULATORY_CARE_PROVIDER_SITE_OTHER): Payer: Medicare Other | Admitting: Internal Medicine

## 2018-05-09 VITALS — BP 132/64 | HR 83 | Temp 98.1°F | Resp 16 | Ht 63.0 in | Wt 150.8 lb

## 2018-05-09 DIAGNOSIS — I10 Essential (primary) hypertension: Secondary | ICD-10-CM | POA: Diagnosis not present

## 2018-05-09 DIAGNOSIS — M5416 Radiculopathy, lumbar region: Secondary | ICD-10-CM | POA: Insufficient documentation

## 2018-05-09 DIAGNOSIS — R7303 Prediabetes: Secondary | ICD-10-CM | POA: Diagnosis not present

## 2018-05-09 DIAGNOSIS — L539 Erythematous condition, unspecified: Secondary | ICD-10-CM | POA: Insufficient documentation

## 2018-05-09 LAB — COMPREHENSIVE METABOLIC PANEL
ALT: 14 U/L (ref 0–35)
AST: 19 U/L (ref 0–37)
Albumin: 4.2 g/dL (ref 3.5–5.2)
Alkaline Phosphatase: 101 U/L (ref 39–117)
BUN: 24 mg/dL — ABNORMAL HIGH (ref 6–23)
CHLORIDE: 103 meq/L (ref 96–112)
CO2: 30 mEq/L (ref 19–32)
Calcium: 10.3 mg/dL (ref 8.4–10.5)
Creatinine, Ser: 1.04 mg/dL (ref 0.40–1.20)
GFR: 50.57 mL/min — ABNORMAL LOW (ref >60.00)
Glucose, Bld: 96 mg/dL (ref 70–99)
Potassium: 4.3 mEq/L (ref 3.5–5.1)
Sodium: 142 mEq/L (ref 135–145)
Total Bilirubin: 0.9 mg/dL (ref 0.2–1.2)
Total Protein: 7.7 g/dL (ref 6.0–8.3)

## 2018-05-09 LAB — HEMOGLOBIN A1C: HEMOGLOBIN A1C: 5.7 % (ref 4.6–6.5)

## 2018-05-09 MED ORDER — GABAPENTIN 100 MG PO CAPS: 100.0000 mg | ORAL_CAPSULE | Freq: Every day | ORAL | 5 refills | Status: DC

## 2018-05-09 MED ORDER — AMLODIPINE BESYLATE 5 MG PO TABS: ORAL_TABLET | ORAL | 1 refills | Status: DC

## 2018-05-09 NOTE — Assessment & Plan Note (Signed)
BP well controlled Current regimen effective and well tolerated Continue current medications at current doses CMP 

## 2018-05-09 NOTE — Assessment & Plan Note (Signed)
Patchy, blanching erythema left medial ankle Microvascular nature No warmth or tenderness-unlikely cellulitis Will monitor If does not resolve or worsens needs to see vascular

## 2018-05-09 NOTE — Assessment & Plan Note (Signed)
Experiencing pain down the left leg-primarily in upper leg and intermittently down in lower leg-likely lumbar radiculopathy given history Discussed options-we will try gabapentin 100 mg nightly.  Discussed if she has side effects to discontinue Can titrate this if tolerated Discussed imaging, but that we need to do referral to orthopedics or neurosurgery to abdomen the MRI, which we know is not normal based on her prior surgery, arthritis and previous CT scan Can continue Aleve Follow-up in 2 months

## 2018-05-09 NOTE — Progress Notes (Signed)
Subjective:    Patient ID: Barbara [name], female    DOB: 01-02-1933, 85 y.o.   MRN: [id number]  HPI The patient is here for follow up.  Hypertension: She is taking her medication daily. She is compliant with a low sodium diet.  She denies chest pain, palpitations, regular leg edema, shortness of breath and regular headaches. She is exercising irregularly.     Prediabetes:  She is compliant with a low sugar/carbohydrate diet.  She is exercising irregularly.  She would like to lose weight.  Left leg pain:  She saw her rheumatologist and she had a left trochanter injection. It did not help much.  She denies lower back pain.  She has pain in her lateral left upper leg.  The pain sometimes goes below her knee to her mid lateral-medial lower leg.  She denies any numbness or tingling.  She does have a history of a herniated disc and had surgery 2000.  She is taking aleve.  Getting in the car increases her pain.  It started in the fall 2019.    Redness medial left ankle.  There is no pain.  She noticed it about 2-3 weeks ago and it has gotten larger.  It is not warm.  She wondered if it was a circulatory problem.  Medications and allergies reviewed with patient and updated if appropriate.  Patient Active Problem List   Diagnosis Date Noted  . Trochanteric bursitis of left hip 01/18/2018  . Decreased hearing 01/18/2018  . Nephrolithiasis 01/17/2018  . Dizziness 01/01/2018  . Left-sided thoracic back pain 01/01/2018  . Fluttering heart 12/06/2016  . Prediabetes 06/25/2016  . Aortic atherosclerosis (HCC) 06/22/2016  . Osteoporosis 06/22/2016  . Bilateral primary osteoarthritis of first carpometacarpal joints 08/17/2015  . Hypertension 03/18/2015  . Temporal arteritis (HCC) 01/05/2015  . Aneurysm, ophthalmic artery 12/11/2014    Current Outpatient Medications on File Prior to Visit  Medication Sig Dispense Refill  . albuterol (PROVENTIL HFA;VENTOLIN HFA) 108 (90 Base) MCG/ACT inhaler  Inhale 2 puffs into the lungs every 6 (six) hours as needed for wheezing or shortness of breath. 1 Inhaler 0  . amLODipine (NORVASC) 5 MG tablet Take 1 tablet daily. Need office visit for more refills. 30 tablet 0  . Calcium-Vitamin D-Vitamin K 500-500-40 MG-UNT-MCG CHEW Chew by mouth.    . fish oil-omega-3 fatty acids 1000 MG capsule Take 2 g by mouth daily.    . Methylsulfonylmethane (MSM PO) Take by mouth.    . NON FORMULARY Slimfast powder    . Nutritional Supplements (ANTI-INFLAMMATORY ENZYME) CAPS APPLY 1 2 GMS TO AFFECTED AREA 3 4 TIMES DAILY AS NEEDED FOR PAIN  THIS IS A COMPOUNDED CREAM 1 PUMP EQUALS 1 GRAM  1   No current facility-administered medications on file prior to visit.     Past Medical History:  Diagnosis Date  . Aneurysm, ophthalmic artery   . Colonic polyp   . Diverticulosis   . Hypertension   . Urine incontinence     Past Surgical History:  Procedure Laterality Date  . disk removal      Social History   Socioeconomic History  . Marital status: Married    Spouse name: Not on file  . Number of children: Not on file  . Years of education: Not on file  . Highest education level: Not on file  Occupational History  . Not on file  Social Needs  . Financial resource strain: Not on file  . Food insecurity:  Worry: Not on file    Inability: Not on file  . Transportation needs:    Medical: Not on file    Non-medical: Not on file  Tobacco Use  . Smoking status: Former Games developer  . Smokeless tobacco: Former Engineer, water and Sexual Activity  . Alcohol use: No  . Drug use: No  . Sexual activity: Not on file  Lifestyle  . Physical activity:    Days per week: Not on file    Minutes per session: Not on file  . Stress: Not on file  Relationships  . Social connections:    Talks on phone: Not on file    Gets together: Not on file    Attends religious service: Not on file    Active member of club or organization: Not on file    Attends meetings of clubs  or organizations: Not on file    Relationship status: Not on file  Other Topics Concern  . Not on file  Social History Narrative  . Not on file    No family history on file.  Review of Systems  Constitutional: Negative for chills and fever.  Respiratory: Negative for cough, shortness of breath and wheezing.   Cardiovascular: Positive for leg swelling (occ with inc sodium intake). Negative for chest pain and palpitations.  Neurological: Negative for light-headedness and headaches.       Objective:   Vitals:   05/09/18 1453  BP: 132/64  Pulse: 83  Resp: 16  Temp: 98.1 F (36.7 C)  SpO2: 95%   BP Readings from Last 3 Encounters:  05/09/18 132/64  01/18/18 (!) 144/78  01/01/18 (!) 150/92   Wt Readings from Last 3 Encounters:  05/09/18 150 lb 12.8 oz (68.4 kg)  01/18/18 149 lb (67.6 kg)  01/01/18 147 lb 12.8 oz (67 kg)   Body mass index is 26.71 kg/m.   Physical Exam    Constitutional: Appears well-developed and well-nourished. No distress.  HENT:  Head: Normocephalic and atraumatic.  Neck: Neck supple. No tracheal deviation present. No thyromegaly present.  No cervical lymphadenopathy Cardiovascular: Normal rate, regular rhythm and normal heart sounds.   No murmur heard. No carotid bruit .  No edema.  Decreased but palpable bilateral DP pulses Pulmonary/Chest: Effort normal and breath sounds normal. No respiratory distress. No has no wheezes. No rales. Musculoskeletal: Mild lumbar spine tenderness with palpation-she states this is chronic since her surgery, mild tenderness left buttock region Neurological: Normal sensation bilateral lower extremities, normal strength Skin: Skin is warm and dry. Not diaphoretic.  Blanching redness medial left ankle that is not warm and nontender. Psychiatric: Normal mood and affect. Behavior is normal.      Assessment & Plan:    See Problem List for Assessment and Plan of chronic medical problems.

## 2018-05-09 NOTE — Assessment & Plan Note (Signed)
Check a1c Low sugar / carb diet Stressed regular exercise   

## 2018-05-09 NOTE — Patient Instructions (Addendum)
  Tests ordered today. Your results will be released to MyChart (or called to you) after review, usually within 72hours after test completion. If any changes need to be made, you will be notified at that same time.   Medications reviewed and updated.  Changes include :   Try gabapentin 100 mg nightly.    Your prescription(s) have been submitted to your pharmacy. Please take as directed and contact our office if you believe you are having problem(s) with the medication(s).   Please followup in 2 months

## 2018-05-10 ENCOUNTER — Encounter: Payer: Self-pay | Admitting: Internal Medicine

## 2018-07-03 ENCOUNTER — Ambulatory Visit: Payer: Self-pay

## 2018-07-03 ENCOUNTER — Ambulatory Visit (INDEPENDENT_AMBULATORY_CARE_PROVIDER_SITE_OTHER): Payer: Medicare Other | Admitting: Internal Medicine

## 2018-07-03 ENCOUNTER — Encounter: Payer: Self-pay | Admitting: Internal Medicine

## 2018-07-03 DIAGNOSIS — R42 Dizziness and giddiness: Secondary | ICD-10-CM | POA: Diagnosis not present

## 2018-07-03 NOTE — Telephone Encounter (Signed)
Do you want to try to do virtual today?

## 2018-07-03 NOTE — Assessment & Plan Note (Signed)
She has had 4 episodes, the first was November 2019 and the most recent is now Dizziness is a true spinning sensation occurs with moving her head backwards She has seen Dr. Jearld Fenton for this and he thought this was likely BPPV and I agree Discussed meclizine, but she would like to hold off for now She may try to see Dr. Jearld Fenton May benefit from Epley maneuver  She will let me know if she is not able to see Dr. Lorri Frederick or if she like to try the meclizine.

## 2018-07-03 NOTE — Telephone Encounter (Signed)
Yes, 4:15

## 2018-07-03 NOTE — Telephone Encounter (Signed)
Pt called to say she has vertigo for 4 days. She states the room is spinning.  She states that she has had this vertigo before and seen by Dr Jearld Fenton.  She states her problem is mainly at night.  She states that when she lays her head down the room spins.  She rates the night time vertigo as moderate.  She states that she has to sit on the edge of the bed prior to getting up.  Today she purposely laid her head back and brought on the vertigo. She states that daytime vertigo is very mild. She is able to do all her activities. Her BP today is 141/78. She states that her BP have been consistent over several days. She states that she had some nausea this AM for the first time while showering.  She states that she feels that the vertigo symptoms are progressing.  She denies weakness, numbness,headache, ear pain. Care advice read to patient.  Pt verbalized understanding of all instructions.Call placed to [facility] at the office for appointment. Pt is aware that office will contact her today.  Reason for Disposition . Vomiting occurs with dizziness  Answer Assessment - Initial Assessment Questions 1. DESCRIPTION: "Describe your dizziness."     really spinning 2. VERTIGO: "Do you feel like either you or the room is spinning or tilting?"      spinning 3. LIGHTHEADED: "Do you feel lightheaded?" (e.g., somewhat faint, woozy, weak upon standing)     No 4. SEVERITY: "How bad is it?"  "Can you walk?"   - MILD - Feels unsteady but walking normally.   - MODERATE - Feels very unsteady when walking, but not falling; interferes with normal activities (e.g., school, work) .   - SEVERE - Unable to walk without falling (requires assistance).    Mild during day, night it is moderate 5. ONSET:  "When did the dizziness begin?"     4 days ago 6. AGGRAVATING FACTORS: "Does anything make it worse?" (e.g., standing, change in head position)    Change in position lying down seems to bring it on 7. CAUSE: "What do you think is  causing the dizziness?"    nothing 8. RECURRENT SYMPTOM: "Have you had dizziness before?" If so, ask: "When was the last time?" "What happened that time?"     01/19/2019 9. OTHER SYMPTOMS: "Do you have any other symptoms?" (e.g., headache, weakness, numbness, vomiting, earache)     Dry heaves today in shower 10. PREGNANCY: "Is there any chance you are pregnant?" "When was your last menstrual period?"       N/A  Protocols used: DIZZINESS - VERTIGO-A-AH

## 2018-07-03 NOTE — Progress Notes (Signed)
Virtual Visit via Video Note  I connected with Barbara [name] on 07/03/18 at  4:15 PM EDT by a video enabled telemedicine application and verified that I am speaking with the correct person using two identifiers.   I discussed the limitations of evaluation and management by telemedicine and the availability of in person appointments. The patient expressed understanding and agreed to proceed.  The patient is currently at home and I am in the office.    No referring provider.    History of Present Illness: This is an acute visit for dizziness.  4 nights ago she started experiencing dizziness.  The dizziness is a true spinning sensation.  She states during the day for the most part she is okay except for today she did notice that when she leans her head back on the chair when sitting she will experience some dizziness.  She is unsure if she had this before because she just did this today.  She typically has symptoms at night when she is laying back.  She does not have a lot of dizziness with rolling over or other changes in position.  She denies nausea.  She has not had any numbness, tingling or weakness in her arms or legs.  She denies ear pain, cold symptoms, fever and change in her hearing.  She has had 3 prior episodes.  Her first was in November 2019.  This 1 seems to be lasting a little bit longer.  I did see her in the past and she also saw ENT, Dr. Jearld FentonByers.  He diagnosed her with probable BPPV.     Review of Systems  Constitutional: Negative for fever.  HENT: Negative for congestion, ear pain, hearing loss (no new change) and sinus pain.   Cardiovascular: Negative for chest pain and palpitations.  Gastrointestinal: Positive for nausea (once).  Neurological: Positive for dizziness. Negative for tingling, focal weakness and headaches.     Social History   Socioeconomic History  . Marital status: Married    Spouse name: Not on file  . Number of children: Not on file  . Years of  education: Not on file  . Highest education level: Not on file  Occupational History  . Not on file  Social Needs  . Financial resource strain: Not on file  . Food insecurity:    Worry: Not on file    Inability: Not on file  . Transportation needs:    Medical: Not on file    Non-medical: Not on file  Tobacco Use  . Smoking status: Former Games developermoker  . Smokeless tobacco: Former Engineer, waterUser  Substance and Sexual Activity  . Alcohol use: No  . Drug use: No  . Sexual activity: Not on file  Lifestyle  . Physical activity:    Days per week: Not on file    Minutes per session: Not on file  . Stress: Not on file  Relationships  . Social connections:    Talks on phone: Not on file    Gets together: Not on file    Attends religious service: Not on file    Active member of club or organization: Not on file    Attends meetings of clubs or organizations: Not on file    Relationship status: Not on file  Other Topics Concern  . Not on file  Social History Narrative  . Not on file     Observations/Objective: Appears well in NAD Breathing normally Nonfocal  BP at home    141/66  MRA  of head 12/10/2014: Normal appearance of cranial nerves.  Medial directed 5.0 mm left ICA aneurysm at the ophthalmic segment.  This is at the level of the left third radial nerve.  MRI circle of Willis is otherwise unremarkable.  Assessment and Plan:  See Problem List for Assessment and Plan of chronic medical problems.   Follow Up Instructions:    I discussed the assessment and treatment plan with the patient. The patient was provided an opportunity to ask questions and all were answered. The patient agreed with the plan and demonstrated an understanding of the instructions.   The patient was advised to call back or seek an in-person evaluation if the symptoms worsen or if the condition fails to improve as anticipated.    Pincus Sanes, MD

## 2018-07-03 NOTE — Telephone Encounter (Signed)
Will you please open up a 4:15 and add her. She is already aware. Thank you!

## 2018-07-04 ENCOUNTER — Telehealth: Payer: Self-pay | Admitting: Internal Medicine

## 2018-07-04 NOTE — Telephone Encounter (Signed)
FYI Patient has cancelled appt scheduled for the 27th due to just seeing Dr. Lawerance Bach. She also would like Dr. Lawerance Bach to know that she is going to take meclizine for vertigo.

## 2018-07-04 NOTE — Telephone Encounter (Signed)
FYI

## 2018-07-04 NOTE — Telephone Encounter (Signed)
Does she need a prescription for the meclizine?

## 2018-07-04 NOTE — Telephone Encounter (Signed)
Patient states she does not need an rx. States she taking dramamine OTC

## 2018-07-09 ENCOUNTER — Ambulatory Visit: Payer: Medicare Other | Admitting: Internal Medicine

## 2018-07-13 DIAGNOSIS — Z87891 Personal history of nicotine dependence: Secondary | ICD-10-CM | POA: Diagnosis not present

## 2018-07-13 DIAGNOSIS — R42 Dizziness and giddiness: Secondary | ICD-10-CM | POA: Diagnosis not present

## 2018-07-18 ENCOUNTER — Telehealth: Payer: Self-pay

## 2018-07-18 DIAGNOSIS — R42 Dizziness and giddiness: Secondary | ICD-10-CM

## 2018-07-18 NOTE — Telephone Encounter (Signed)
New referral ordered for ENT

## 2018-07-18 NOTE — Telephone Encounter (Signed)
Copied from CRM [id number]. Topic: Referral - Request for Referral >> Jul 18, 2018  2:18 PM Jay Schlichter wrote: Has patient seen PCP for this complaint? Yes.   *If NO, is insurance requiring patient see PCP for this issue before PCP can refer them? Referral for which specialty: ear dr, or neurologist  Preferred provider/office:  Reason for referral: vertigo  Pt has not been able to get in yet with Dr Jearld Fenton and would like referral changed to someone else,  Cb is [phone] Pt would like call back

## 2018-07-18 NOTE — Telephone Encounter (Signed)
Please advise. I was looking at a past referral.

## 2018-07-18 NOTE — Telephone Encounter (Signed)
There is no recent referral for pt

## 2018-07-18 NOTE — Telephone Encounter (Signed)
Do you know what is going on with this referral by chance?

## 2018-07-19 NOTE — Telephone Encounter (Signed)
Spoke to pt  regarding referral

## 2018-07-24 DIAGNOSIS — H8111 Benign paroxysmal vertigo, right ear: Secondary | ICD-10-CM | POA: Diagnosis not present

## 2018-07-24 DIAGNOSIS — R42 Dizziness and giddiness: Secondary | ICD-10-CM | POA: Diagnosis not present

## 2018-08-20 ENCOUNTER — Ambulatory Visit: Payer: Self-pay

## 2018-08-20 NOTE — Telephone Encounter (Signed)
Pt called stating that she has noticed dips in her BP she states that she take medication for BP but she has noticed that she will feel real weak and tired and her BP will be low.  Saturday her BP 107/64 and then afternoon 82/56, and evening 136/78.  Today 132/74 then while talking with me 167/93.  She has had edema in her ankles. She states she is real tired. She takes amlodipine. Per protocol call was transferred to office for scheduling. Care advice read to patient including keep a BP log. Pt verbalized understanding of all instructions.  Reason for Disposition . [5] Fall in systolic BP > 20 mm Hg from normal AND [2] NOT dizzy, lightheaded, or weak  Answer Assessment - Initial Assessment Questions 1. BLOOD PRESSURE: "What is the blood pressure?" "Did you take at least two measurements 5 minutes apart?"     132/74,  2. ONSET: "When did you take your blood pressure?"   today 3. HOW: "How did you obtain the blood pressure?" (e.g., visiting nurse, automatic home BP monitor)     home 4. HISTORY: "Do you have a history of low blood pressure?" "What is your blood pressure normally?"     yes 5. MEDICATIONS: "Are you taking any medications for blood pressure?" If yes: "Have they been changed recently?"     No 6. PULSE RATE: "Do you know what your pulse rate is?"      74 7. OTHER SYMPTOMS: "Have you been sick recently?" "Have you had a recent injury?"    Tired, weak feelings, swelling in ankles 8. PREGNANCY: "Is there any chance you are pregnant?" "When was your last menstrual period?"    N/A  Protocols used: LOW BLOOD PRESSURE-A-AH

## 2018-08-20 NOTE — Telephone Encounter (Signed)
Doing a doxy with you tomorrow at 10:00

## 2018-08-20 NOTE — Progress Notes (Signed)
Virtual Visit via Video Note  I connected with Barbara [name] on 08/21/18 at 10:00 AM EDT by a video enabled telemedicine application and verified that I am speaking with the correct person using two identifiers.   I discussed the limitations of evaluation and management by telemedicine and the availability of in person appointments. The patient expressed understanding and agreed to proceed.  The patient is currently at home and I am in the office.    No referring provider.    History of Present Illness: This is an acute visit for variable BP, edema  Erratic blood pressure.  She takes the amlodipine daily as prescribed.  Her blood pressure has been very erratic-her systolic blood pressure is ranged from 94-854 and diastolic blood pressure is been in the 60s-70s.  Sunday she is taking it several times and it has ranged greatly for no reason.  She does feel tired and weak when her blood pressure is on the low side.  She typically is compliant with a low-sodium diet.  She does have some swelling in her ankles-the left ankle is worse than the right.  She does have the left leg pain and she figured that is why the left ankle is worse.  She was concerned the side effect of edema was related to the amlodipine.  She still has the pain on the left leg and was diagnosed with hip bursitis, but there is also some question of sciatica.  She did have an injection by Dr. Trudie Reed and that did help some.  She still has the pain is unsure what to do.  She wondered if the pain was increasing her blood pressure as well.  She feels her cuff is reliable.   Social History   Socioeconomic History  . Marital status: Married    Spouse name: Not on file  . Number of children: Not on file  . Years of education: Not on file  . Highest education level: Not on file  Occupational History  . Not on file  Social Needs  . Financial resource strain: Not on file  . Food insecurity:    Worry: Not on file    Inability: Not  on file  . Transportation needs:    Medical: Not on file    Non-medical: Not on file  Tobacco Use  . Smoking status: Former Research scientist (life sciences)  . Smokeless tobacco: Former Network engineer and Sexual Activity  . Alcohol use: No  . Drug use: No  . Sexual activity: Not on file  Lifestyle  . Physical activity:    Days per week: Not on file    Minutes per session: Not on file  . Stress: Not on file  Relationships  . Social connections:    Talks on phone: Not on file    Gets together: Not on file    Attends religious service: Not on file    Active member of club or organization: Not on file    Attends meetings of clubs or organizations: Not on file    Relationship status: Not on file  Other Topics Concern  . Not on file  Social History Narrative  . Not on file     Observations/Objective: Appears well in NAD  BP Readings from Last 3 Encounters:  05/09/18 132/64  01/18/18 (!) 144/78  01/01/18 (!) 150/92      Assessment and Plan:  See Problem List for Assessment and Plan of chronic medical problems.   Follow Up Instructions:    I discussed  the assessment and treatment plan with the patient. The patient was provided an opportunity to ask questions and all were answered. The patient agreed with the plan and demonstrated an understanding of the instructions.   The patient was advised to call back or seek an in-person evaluation if the symptoms worsen or if the condition fails to improve as anticipated.    Binnie Rail, MD

## 2018-08-21 ENCOUNTER — Encounter: Payer: Self-pay | Admitting: Internal Medicine

## 2018-08-21 ENCOUNTER — Ambulatory Visit (INDEPENDENT_AMBULATORY_CARE_PROVIDER_SITE_OTHER): Payer: Medicare Other | Admitting: Internal Medicine

## 2018-08-21 DIAGNOSIS — M7062 Trochanteric bursitis, left hip: Secondary | ICD-10-CM | POA: Diagnosis not present

## 2018-08-21 DIAGNOSIS — I10 Essential (primary) hypertension: Secondary | ICD-10-CM

## 2018-08-21 DIAGNOSIS — R600 Localized edema: Secondary | ICD-10-CM | POA: Diagnosis not present

## 2018-08-21 MED ORDER — TELMISARTAN 20 MG PO TABS: 20.0000 mg | ORAL_TABLET | Freq: Every day | ORAL | 5 refills | Status: DC

## 2018-08-21 NOTE — Assessment & Plan Note (Signed)
Has been diagnosed with bursitis of the left hip and received a steroid injection from her rheumatologist Still has some pain,?  If this is hip bursitis and/or left-sided lumbar radiculopathy Discussed that she should either see her rheumatologist again or he can refer to sports medicine/orthopedics for further evaluation

## 2018-08-21 NOTE — Assessment & Plan Note (Signed)
Her blood pressure has been elevated here, but is not as variable as it has been at home She feels her cuff is reliable, but if she continues to have that variability we may need to have her calibrate her blood pressure cuff Since she is having some leg edema and extremely variable blood pressure we will discontinue the amlodipine Trial of telmisartan 20 mg daily She will update me with her blood pressure numbers to see if her blood pressure is better controlled with this medication

## 2018-08-28 ENCOUNTER — Other Ambulatory Visit: Payer: Self-pay | Admitting: Internal Medicine

## 2018-08-28 NOTE — Telephone Encounter (Signed)
Increase medication to 2 pills daily and have her let us know how this affects her blood pressure in 1 week

## 2018-08-28 NOTE — Telephone Encounter (Signed)
Copied from [place] [id number] [id number]. Topic: General - Other >> Aug 28, 2018  1:22 PM Mcneil, Ja-Kwan wrote: Reason for CRM: Pt stated she was asked to call back to report how she was doing on the new blood pressure medication. Pt stated her blood pressure is up and it is staying around 162/90. Pt stated she has given it a few days but it does not seem to be working for her. Pt requests a call back.

## 2018-08-28 NOTE — Telephone Encounter (Signed)
Please advise 

## 2018-08-29 MED ORDER — TELMISARTAN 20 MG PO TABS: 40.0000 mg | ORAL_TABLET | Freq: Every day | ORAL | 5 refills | Status: DC

## 2018-08-29 NOTE — Telephone Encounter (Signed)
Patient called back.  Gave her MD response.  PT states understanding and will report back in one week with results.

## 2018-08-29 NOTE — Telephone Encounter (Signed)
LVM for pt to call back to go over message below.

## 2018-08-30 DIAGNOSIS — M7072 Other bursitis of hip, left hip: Secondary | ICD-10-CM | POA: Diagnosis not present

## 2018-08-30 DIAGNOSIS — M150 Primary generalized (osteo)arthritis: Secondary | ICD-10-CM | POA: Diagnosis not present

## 2018-08-30 DIAGNOSIS — M316 Other giant cell arteritis: Secondary | ICD-10-CM | POA: Diagnosis not present

## 2018-09-06 ENCOUNTER — Telehealth: Payer: Self-pay | Admitting: Internal Medicine

## 2018-09-06 MED ORDER — TELMISARTAN 20 MG PO TABS: 40.0000 mg | ORAL_TABLET | Freq: Every day | ORAL | 5 refills | Status: DC

## 2018-09-06 NOTE — Telephone Encounter (Signed)
Did advise that she can see someone for an appointment tomorrow if she wanted. She said she will call back if she felt like that was needed.

## 2018-09-06 NOTE — Telephone Encounter (Signed)
161/85 yesterday am 133/72 noon 161/87 pm  150/92 this am 139/76 noon   BP is fluctuating is wondering if she should have change in medication also legs are "puffy" BP seems higher in am. Feels fluttering in heart since med change. Please advise.

## 2018-09-06 NOTE — Telephone Encounter (Signed)
Patient calling and would like a call back from Dr Quay Burow or her assistant regarding her blood pressure medication. States that she is unsure whether she wants to continue the medication she is currently on or to see about changing the medication. Please advise.

## 2018-09-07 ENCOUNTER — Other Ambulatory Visit: Payer: Self-pay

## 2018-09-07 MED ORDER — TELMISARTAN 20 MG PO TABS: 40.0000 mg | ORAL_TABLET | Freq: Every day | ORAL | 5 refills | Status: DC

## 2018-09-07 NOTE — Telephone Encounter (Signed)
rx for telmesartan resent to pharmacy---patient advised

## 2018-09-07 NOTE — Telephone Encounter (Signed)
Routing to [place]/sched---can you please check to see if patient wants to come for visit with dr burns next week?---thanks

## 2018-09-07 NOTE — Telephone Encounter (Signed)
Patient called to say that the Pharmacy did not receive the Rx for her telmisartan (MICARDIS) 20 MG tablet asking for it to be resent and for a call back when done please. Ph# [phone]

## 2018-09-07 NOTE — Telephone Encounter (Signed)
If she has not seen someone this week she should come in next week for a visit

## 2018-09-07 NOTE — Telephone Encounter (Signed)
Appointment scheduled.

## 2018-09-10 NOTE — Progress Notes (Signed)
Subjective:    Patient ID: Barbara [name], female    DOB: 15-Nov-1932, 85 y.o.   MRN: [id number]  HPI The patient is here for an acute visit.   Elevated hypertension: She is taking her medication daily as prescribed.  She does feel jittery with the medication and still has leg edema.  has been monitoring her blood pressure at home and has been elevated:  167/86, 167/86, 125/77, 133/86, 144/78, 160/91, 142/89.    She is compliant with a low sodium diet.  She is working in the yard.     Medications and allergies reviewed with patient and updated if appropriate.  Patient Active Problem List   Diagnosis Date Noted  . Bilateral leg edema 08/21/2018  . Left lumbar radiculopathy 05/09/2018  . Erythema of skin 05/09/2018  . Trochanteric bursitis of left hip 01/18/2018  . Decreased hearing 01/18/2018  . Nephrolithiasis 01/17/2018  . Dizziness 01/01/2018  . Left-sided thoracic back pain 01/01/2018  . Fluttering heart 12/06/2016  . Prediabetes 06/25/2016  . Aortic atherosclerosis (HCC) 06/22/2016  . Osteoporosis 06/22/2016  . Bilateral primary osteoarthritis of first carpometacarpal joints 08/17/2015  . Hypertension 03/18/2015  . Temporal arteritis (HCC) 01/05/2015  . Aneurysm, ophthalmic artery 12/11/2014    Current Outpatient Medications on File Prior to Visit  Medication Sig Dispense Refill  . albuterol (PROVENTIL HFA;VENTOLIN HFA) 108 (90 Base) MCG/ACT inhaler Inhale 2 puffs into the lungs every 6 (six) hours as needed for wheezing or shortness of breath. 1 Inhaler 0  . Calcium-Vitamin D-Vitamin K 500-500-40 MG-UNT-MCG CHEW Chew by mouth.    . diclofenac sodium (VOLTAREN) 1 % GEL     . fish oil-omega-3 fatty acids 1000 MG capsule Take 2 g by mouth daily.    Marland Kitchen. gabapentin (NEURONTIN) 100 MG capsule Take 1 capsule (100 mg total) by mouth at bedtime. 30 capsule 5  . Methylsulfonylmethane (MSM PO) Take by mouth.    . NON FORMULARY Slimfast powder    . Nutritional Supplements  (ANTI-INFLAMMATORY ENZYME) CAPS APPLY 1 2 GMS TO AFFECTED AREA 3 4 TIMES DAILY AS NEEDED FOR PAIN  THIS IS A COMPOUNDED CREAM 1 PUMP EQUALS 1 GRAM  1  . telmisartan (MICARDIS) 20 MG tablet Take 2 tablets (40 mg total) by mouth daily. 30 tablet 5   No current facility-administered medications on file prior to visit.     Past Medical History:  Diagnosis Date  . Aneurysm, ophthalmic artery   . Colonic polyp   . Diverticulosis   . Hypertension   . Urine incontinence     Past Surgical History:  Procedure Laterality Date  . disk removal      Social History   Socioeconomic History  . Marital status: Married    Spouse name: Not on file  . Number of children: Not on file  . Years of education: Not on file  . Highest education level: Not on file  Occupational History  . Not on file  Social Needs  . Financial resource strain: Not on file  . Food insecurity    Worry: Not on file    Inability: Not on file  . Transportation needs    Medical: Not on file    Non-medical: Not on file  Tobacco Use  . Smoking status: Former Games developermoker  . Smokeless tobacco: Former Engineer, waterUser  Substance and Sexual Activity  . Alcohol use: No  . Drug use: No  . Sexual activity: Not on file  Lifestyle  . Physical activity  Days per week: Not on file    Minutes per session: Not on file  . Stress: Not on file  Relationships  . Social Herbalist on phone: Not on file    Gets together: Not on file    Attends religious service: Not on file    Active member of club or organization: Not on file    Attends meetings of clubs or organizations: Not on file    Relationship status: Not on file  Other Topics Concern  . Not on file  Social History Narrative  . Not on file    No family history on file.  Review of Systems  Constitutional: Negative for fever.  Respiratory: Negative for shortness of breath.   Cardiovascular: Positive for palpitations (occasional) and leg swelling. Negative for chest  pain.  Neurological: Negative for dizziness, light-headedness and headaches.       Objective:   Vitals:   09/11/18 1021  BP: (!) 152/94  Pulse: 70  Resp: 16  Temp: 98.2 F (36.8 C)  SpO2: 98%   BP Readings from Last 3 Encounters:  09/11/18 (!) 152/94  05/09/18 132/64  01/18/18 (!) 144/78   Wt Readings from Last 3 Encounters:  09/11/18 151 lb 1.9 oz (68.5 kg)  05/09/18 150 lb 12.8 oz (68.4 kg)  01/18/18 149 lb (67.6 kg)   Body mass index is 26.77 kg/m.   Physical Exam    Constitutional: Appears well-developed and well-nourished. No distress.  HENT:  Head: Normocephalic and atraumatic.  Neck: Neck supple. No tracheal deviation present. No thyromegaly present.  No cervical lymphadenopathy Cardiovascular: Normal rate, regular rhythm and normal heart sounds.  No murmur heard. No carotid bruit .  Varicose veins B/L LE,  No edema Pulmonary/Chest: Effort normal and breath sounds normal. No respiratory distress. No has no wheezes. No rales.  Skin: Skin is warm and dry. Not diaphoretic.      Assessment & Plan:    See Problem List for Assessment and Plan of chronic medical problems.

## 2018-09-11 ENCOUNTER — Telehealth: Payer: Self-pay | Admitting: Internal Medicine

## 2018-09-11 ENCOUNTER — Other Ambulatory Visit: Payer: Self-pay

## 2018-09-11 ENCOUNTER — Encounter: Payer: Self-pay | Admitting: Internal Medicine

## 2018-09-11 ENCOUNTER — Ambulatory Visit (INDEPENDENT_AMBULATORY_CARE_PROVIDER_SITE_OTHER): Payer: Medicare Other | Admitting: Internal Medicine

## 2018-09-11 VITALS — BP 152/94 | HR 70 | Temp 98.2°F | Resp 16 | Ht 63.0 in | Wt 151.1 lb

## 2018-09-11 DIAGNOSIS — I10 Essential (primary) hypertension: Secondary | ICD-10-CM

## 2018-09-11 MED ORDER — METOPROLOL SUCCINATE ER 25 MG PO TB24: 25.0000 mg | ORAL_TABLET | Freq: Every day | ORAL | 3 refills | Status: DC

## 2018-09-11 MED ORDER — NEBIVOLOL HCL 5 MG PO TABS: 5.0000 mg | ORAL_TABLET | Freq: Every day | ORAL | 5 refills | Status: DC

## 2018-09-11 NOTE — Assessment & Plan Note (Signed)
BP variable and not ideally controlled Not tolerating telmisartan - causes jitteriness - will d/c Start bystolic 5 mg daily if tolerated Monitor BP  She will update me via mychart F/u in 3 months in office

## 2018-09-11 NOTE — Telephone Encounter (Signed)
Need med sent to [facility] - metoprolol

## 2018-09-11 NOTE — Telephone Encounter (Signed)
Pt aware.

## 2018-09-11 NOTE — Telephone Encounter (Signed)
Pt called in and stated that the BP med that was called in today is going to be $67.00 she would like to see if there was something cheaper?  She is going to call ins compy and see what is in her formulary that they will cover and call back and let us know

## 2018-09-11 NOTE — Patient Instructions (Addendum)
  Medications reviewed and updated.  Changes include :   Stop  telmisartan and start bystolic 5 mg daily.    Your prescription(s) have been submitted to your pharmacy. Please take as directed and contact our office if you believe you are having problem(s) with the medication(s).   Monitor your BP at home and update me via mychart with your BP readings.  Work on starting walking.     Please followup in 3 months

## 2018-09-25 ENCOUNTER — Telehealth: Payer: Self-pay | Admitting: Internal Medicine

## 2018-09-25 NOTE — Telephone Encounter (Signed)
Patient stated that she will give office a call when she is ready to schedule her wellness visit. SF

## 2018-10-03 ENCOUNTER — Other Ambulatory Visit: Payer: Self-pay

## 2018-10-03 ENCOUNTER — Ambulatory Visit (INDEPENDENT_AMBULATORY_CARE_PROVIDER_SITE_OTHER): Payer: Medicare Other | Admitting: Internal Medicine

## 2018-10-03 ENCOUNTER — Encounter: Payer: Self-pay | Admitting: Internal Medicine

## 2018-10-03 DIAGNOSIS — R2681 Unsteadiness on feet: Secondary | ICD-10-CM | POA: Diagnosis not present

## 2018-10-03 DIAGNOSIS — I10 Essential (primary) hypertension: Secondary | ICD-10-CM

## 2018-10-03 MED ORDER — AMLODIPINE BESYLATE 2.5 MG PO TABS: 2.5000 mg | ORAL_TABLET | Freq: Every day | ORAL | 5 refills | Status: DC

## 2018-10-03 NOTE — Patient Instructions (Addendum)
Stop the metoprolol.  Your off balanced feeling should hopefully go away.    Start amlodipine 2.5 mg daily. This was sent to your pharmacy.    Monitor your BP at home - the goal is less than 140/90 - let me know what your measures are in a week or two.  We can add another low dose of a second medication if needed.

## 2018-10-03 NOTE — Progress Notes (Signed)
Subjective:    Patient ID: Barbara [name], female    DOB: 02-15-1933, 85 y.o.   MRN: [id number]  HPI The patient is here for an acute visit for persistently elevated BP   Hypertension: She is taking her medication daily. She feels more off balanced with the metoprolol.  She denies any dizziness or lightheadedness.  She is compliant with a low sodium diet.  She denies chest pain and regular headaches. She does monitor her blood pressure at home.      At home 160-170/80's, 166/92      Medications and allergies reviewed with patient and updated if appropriate.  Patient Active Problem List   Diagnosis Date Noted  . Bilateral leg edema 08/21/2018  . Left lumbar radiculopathy 05/09/2018  . Erythema of skin 05/09/2018  . Trochanteric bursitis of left hip 01/18/2018  . Decreased hearing 01/18/2018  . Nephrolithiasis 01/17/2018  . Dizziness 01/01/2018  . Left-sided thoracic back pain 01/01/2018  . Fluttering heart 12/06/2016  . Prediabetes 06/25/2016  . Aortic atherosclerosis (HCC) 06/22/2016  . Osteoporosis 06/22/2016  . Bilateral primary osteoarthritis of first carpometacarpal joints 08/17/2015  . Uncontrolled hypertension 03/18/2015  . Temporal arteritis (HCC) 01/05/2015  . Aneurysm, ophthalmic artery 12/11/2014    Current Outpatient Medications on File Prior to Visit  Medication Sig Dispense Refill  . albuterol (PROVENTIL HFA;VENTOLIN HFA) 108 (90 Base) MCG/ACT inhaler Inhale 2 puffs into the lungs every 6 (six) hours as needed for wheezing or shortness of breath. 1 Inhaler 0  . Calcium-Vitamin D-Vitamin K 500-500-40 MG-UNT-MCG CHEW Chew by mouth.    . diclofenac sodium (VOLTAREN) 1 % GEL     . fish oil-omega-3 fatty acids 1000 MG capsule Take 2 g by mouth daily.    Marland Kitchen. gabapentin (NEURONTIN) 100 MG capsule Take 1 capsule (100 mg total) by mouth at bedtime. 30 capsule 5  . Methylsulfonylmethane (MSM PO) Take by mouth.    . metoprolol succinate (TOPROL-XL) 25 MG 24 hr tablet  Take 1 tablet (25 mg total) by mouth daily. 30 tablet 3  . NON FORMULARY Slimfast powder    . Nutritional Supplements (ANTI-INFLAMMATORY ENZYME) CAPS APPLY 1 2 GMS TO AFFECTED AREA 3 4 TIMES DAILY AS NEEDED FOR PAIN  THIS IS A COMPOUNDED CREAM 1 PUMP EQUALS 1 GRAM  1   No current facility-administered medications on file prior to visit.     Past Medical History:  Diagnosis Date  . Aneurysm, ophthalmic artery   . Colonic polyp   . Diverticulosis   . Hypertension   . Urine incontinence     Past Surgical History:  Procedure Laterality Date  . disk removal      Social History   Socioeconomic History  . Marital status: Married    Spouse name: Not on file  . Number of children: Not on file  . Years of education: Not on file  . Highest education level: Not on file  Occupational History  . Not on file  Social Needs  . Financial resource strain: Not on file  . Food insecurity    Worry: Not on file    Inability: Not on file  . Transportation needs    Medical: Not on file    Non-medical: Not on file  Tobacco Use  . Smoking status: Former Games developermoker  . Smokeless tobacco: Former Engineer, waterUser  Substance and Sexual Activity  . Alcohol use: No  . Drug use: No  . Sexual activity: Not on file  Lifestyle  . Physical  activity    Days per week: Not on file    Minutes per session: Not on file  . Stress: Not on file  Relationships  . Social Herbalist on phone: Not on file    Gets together: Not on file    Attends religious service: Not on file    Active member of club or organization: Not on file    Attends meetings of clubs or organizations: Not on file    Relationship status: Not on file  Other Topics Concern  . Not on file  Social History Narrative  . Not on file    No family history on file.  Review of Systems  Constitutional: Negative for fever.  Respiratory: Positive for shortness of breath (occ).   Cardiovascular: Positive for palpitations (occ fluttering) and leg  swelling (mild). Negative for chest pain.  Musculoskeletal: Positive for gait problem (balance is off).  Neurological: Negative for dizziness and headaches.       Objective:   Vitals:   10/03/18 1535  BP: (!) 158/94  Pulse: 66  Resp: 16  Temp: 98.3 F (36.8 C)  SpO2: 98%   BP Readings from Last 3 Encounters:  10/03/18 (!) 158/94  09/11/18 (!) 152/94  05/09/18 132/64   Wt Readings from Last 3 Encounters:  10/03/18 151 lb 12.8 oz (68.9 kg)  09/11/18 151 lb 1.9 oz (68.5 kg)  05/09/18 150 lb 12.8 oz (68.4 kg)   Body mass index is 26.89 kg/m.   Physical Exam    Constitutional: Appears well-developed and well-nourished. No distress.  HENT:  Head: Normocephalic and atraumatic.  Neck: Neck supple. No tracheal deviation present. No thyromegaly present.  No cervical lymphadenopathy Cardiovascular: Normal rate, regular rhythm and normal heart sounds.   No murmur heard. No carotid bruit .  No edema Pulmonary/Chest: Effort normal and breath sounds normal. No respiratory distress. No has no wheezes. No rales.  Skin: Skin is warm and dry. Not diaphoretic.  Psychiatric: Normal mood and affect. Behavior is normal.       Assessment & Plan:    See Problem List for Assessment and Plan of chronic medical problems.

## 2018-10-03 NOTE — Assessment & Plan Note (Signed)
Feels off balanced with metoprolol, will d/c Does not want to take a water pill due to urinary incontinence Wants to retry amlodipine at 2.5 mg daily Will try amlodipine only She will monitor BP - she will send me a note via mychart with readings Can consider adding hctz 12.5 or lisinopril 2.5 mg if needed FU in sept as scheduled.

## 2018-10-11 ENCOUNTER — Telehealth: Payer: Self-pay | Admitting: Emergency Medicine

## 2018-10-11 MED ORDER — AMLODIPINE BESYLATE 5 MG PO TABS: 5.0000 mg | ORAL_TABLET | Freq: Every day | ORAL | 3 refills | Status: DC

## 2018-10-11 NOTE — Telephone Encounter (Signed)
She is confirmatory she taking 2.5 mg daily or 5 mg daily-adjust medication list if needed.  Her blood pressure is controlled continue current dose.

## 2018-10-11 NOTE — Telephone Encounter (Signed)
Copied from [place] [id number]. Topic: General - Inquiry >> Oct 11, 2018 10:44 AM Richardo Priest, NT wrote: Reason for CRM: Patient called in to inform PCP that she started 5mg  of BP meds. States she has been doing this for 5 days and states it is regulating it correctly. Please advise.

## 2018-10-11 NOTE — Telephone Encounter (Signed)
FYI

## 2018-10-15 NOTE — Telephone Encounter (Signed)
Spoke with pt, states she is taking 5 mg of amlodipine. Med list up to date.

## 2018-10-29 ENCOUNTER — Telehealth: Payer: Self-pay | Admitting: Internal Medicine

## 2018-10-29 MED ORDER — BENAZEPRIL HCL 5 MG PO TABS: 5.0000 mg | ORAL_TABLET | Freq: Every day | ORAL | 5 refills | Status: DC

## 2018-10-29 NOTE — Telephone Encounter (Signed)
Please advise 

## 2018-10-29 NOTE — Telephone Encounter (Signed)
D/c amlodipine   Benazepril sent to [facility]

## 2018-10-29 NOTE — Telephone Encounter (Signed)
Pt aware.

## 2018-10-29 NOTE — Telephone Encounter (Signed)
Patient is calling regarding amLODipine (NORVASC) 5 MG tablet [[id number]]   Patient is having some side effects- Legs swelling,   Spoke to pharmacist benazepril. Wanting Dr. Quay Burow advise and wanting Lovena Le to call with approval.  Please advise [facility].

## 2018-11-09 DIAGNOSIS — H52223 Regular astigmatism, bilateral: Secondary | ICD-10-CM | POA: Diagnosis not present

## 2018-12-11 NOTE — Patient Instructions (Addendum)
  Tests ordered today. Your results will be released to MyChart (or called to you) after review.  If any changes need to be made, you will be notified at that same time.    Medications reviewed and updated.  Changes include :   none     Please followup in 6 months   

## 2018-12-11 NOTE — Progress Notes (Signed)
Subjective:    Patient ID: Barbara [name], female    DOB: March 28, 1932, 85 y.o.   MRN: [id number]  HPI The patient is here for follow up.  She is exercising regularly - walking some but not has much due to left leg pain.    Hypertension: She is taking her medication daily. She is compliant with a low sodium diet.  She denies chest pain, palpitations, edema, shortness of breath and regular headaches. She does monitor her blood pressure at home - it is less than 140/90 consistently.    Prediabetes:  She is compliant with a low sugar/carbohydrate diet.  She is exercising regularly.  Left leg pain: she is still having left leg pain.  She has a sore spot on her left lateral hip.  She has some achiness in her lower back.  She has had previous back surgery and is trying to get the records from her surgeon, but that was 20 years ago.  The pain radiates down her left leg.  She is no longer taking the gabapentin.  Left mid back pain: She is still experiencing left mid back pain.  But this started approximately 1 year ago.  She does have a kidney stone and a kidney cyst in her left kidney.  She denies any aggravating activity or position.  She is unsure if the gabapentin when she was taking it helped this.  Medications and allergies reviewed with patient and updated if appropriate.  Patient Active Problem List   Diagnosis Date Noted  . Bilateral leg edema 08/21/2018  . Left lumbar radiculopathy 05/09/2018  . Erythema of skin 05/09/2018  . Trochanteric bursitis of left hip 01/18/2018  . Decreased hearing 01/18/2018  . Nephrolithiasis 01/17/2018  . Dizziness 01/01/2018  . Left-sided thoracic back pain 01/01/2018  . Fluttering heart 12/06/2016  . Prediabetes 06/25/2016  . Aortic atherosclerosis ([place]) 06/22/2016  . Osteoporosis 06/22/2016  . Bilateral primary osteoarthritis of first carpometacarpal joints 08/17/2015  . Uncontrolled hypertension 03/18/2015  . Temporal arteritis ([place]) 01/05/2015   . Aneurysm, ophthalmic artery 12/11/2014    Current Outpatient Medications on File Prior to Visit  Medication Sig Dispense Refill  . albuterol (PROVENTIL HFA;VENTOLIN HFA) 108 (90 Base) MCG/ACT inhaler Inhale 2 puffs into the lungs every 6 (six) hours as needed for wheezing or shortness of breath. 1 Inhaler 0  . benazepril (LOTENSIN) 5 MG tablet Take 1 tablet (5 mg total) by mouth daily. 30 tablet 5  . Calcium-Vitamin D-Vitamin K 366-440-34 MG-UNT-MCG CHEW Chew by mouth.    . diclofenac sodium (VOLTAREN) 1 % GEL     . fish oil-omega-3 fatty acids 1000 MG capsule Take 2 g by mouth daily.    Marland Kitchen gabapentin (NEURONTIN) 100 MG capsule Take 1 capsule (100 mg total) by mouth at bedtime. 30 capsule 5  . Methylsulfonylmethane (MSM PO) Take by mouth.    . NON FORMULARY Slimfast powder    . Nutritional Supplements (ANTI-INFLAMMATORY ENZYME) CAPS APPLY 1 2 GMS TO AFFECTED AREA 3 4 TIMES DAILY AS NEEDED FOR PAIN  THIS IS A COMPOUNDED CREAM 1 PUMP EQUALS 1 GRAM  1   No current facility-administered medications on file prior to visit.     Past Medical History:  Diagnosis Date  . Aneurysm, ophthalmic artery   . Colonic polyp   . Diverticulosis   . Hypertension   . Urine incontinence     Past Surgical History:  Procedure Laterality Date  . disk removal      Social  History   Socioeconomic History  . Marital status: Married    Spouse name: Not on file  . Number of children: Not on file  . Years of education: Not on file  . Highest education level: Not on file  Occupational History  . Not on file  Social Needs  . Financial resource strain: Not on file  . Food insecurity    Worry: Not on file    Inability: Not on file  . Transportation needs    Medical: Not on file    Non-medical: Not on file  Tobacco Use  . Smoking status: Former Games developer  . Smokeless tobacco: Former Engineer, water and Sexual Activity  . Alcohol use: No  . Drug use: No  . Sexual activity: Not on file  Lifestyle   . Physical activity    Days per week: Not on file    Minutes per session: Not on file  . Stress: Not on file  Relationships  . Social Musician on phone: Not on file    Gets together: Not on file    Attends religious service: Not on file    Active member of club or organization: Not on file    Attends meetings of clubs or organizations: Not on file    Relationship status: Not on file  Other Topics Concern  . Not on file  Social History Narrative  . Not on file    No family history on file.  Review of Systems  Constitutional: Negative for chills and fever.  Eyes: Negative for visual disturbance.  Respiratory: Positive for cough (sometimes dry cough). Negative for shortness of breath and wheezing.   Cardiovascular: Positive for palpitations (rare). Negative for chest pain and leg swelling.  Musculoskeletal: Positive for arthralgias and back pain.  Neurological: Negative for dizziness, light-headedness and headaches.       Objective:   Vitals:   12/12/18 0950  BP: (!) 150/72  Pulse: 77  Resp: 16  Temp: 98.2 F (36.8 C)  SpO2: 99%   BP Readings from Last 3 Encounters:  12/12/18 (!) 150/72  10/03/18 (!) 158/94  09/11/18 (!) 152/94   Wt Readings from Last 3 Encounters:  12/12/18 151 lb (68.5 kg)  10/03/18 151 lb 12.8 oz (68.9 kg)  09/11/18 151 lb 1.9 oz (68.5 kg)   Body mass index is 26.75 kg/m.   Physical Exam    Constitutional: Appears well-developed and well-nourished. No distress.  HENT:  Head: Normocephalic and atraumatic.  Neck: Neck supple. No tracheal deviation present. No thyromegaly present.  No cervical lymphadenopathy Cardiovascular: Normal rate, regular rhythm and normal heart sounds.   No murmur heard. No carotid bruit .  No edema Pulmonary/Chest: Effort normal and breath sounds normal. No respiratory distress. No has no wheezes. No rales. Musculoskeletal: No CVA tenderness.  No palpable area and left mid back that is tender.  No  thoracic spine tenderness Skin: Skin is warm and dry. Not diaphoretic.  Psychiatric: Normal mood and affect. Behavior is normal.      Assessment & Plan:    See Problem List for Assessment and Plan of chronic medical problems.

## 2018-12-12 ENCOUNTER — Other Ambulatory Visit: Payer: Self-pay

## 2018-12-12 ENCOUNTER — Other Ambulatory Visit (INDEPENDENT_AMBULATORY_CARE_PROVIDER_SITE_OTHER): Payer: Medicare Other

## 2018-12-12 ENCOUNTER — Encounter: Payer: Self-pay | Admitting: Internal Medicine

## 2018-12-12 ENCOUNTER — Ambulatory Visit (INDEPENDENT_AMBULATORY_CARE_PROVIDER_SITE_OTHER): Payer: Medicare Other | Admitting: Internal Medicine

## 2018-12-12 VITALS — BP 150/72 | HR 77 | Temp 98.2°F | Resp 16 | Ht 63.0 in | Wt 151.0 lb

## 2018-12-12 DIAGNOSIS — M546 Pain in thoracic spine: Secondary | ICD-10-CM

## 2018-12-12 DIAGNOSIS — I700 Atherosclerosis of aorta: Secondary | ICD-10-CM

## 2018-12-12 DIAGNOSIS — M5416 Radiculopathy, lumbar region: Secondary | ICD-10-CM

## 2018-12-12 DIAGNOSIS — R7303 Prediabetes: Secondary | ICD-10-CM | POA: Diagnosis not present

## 2018-12-12 DIAGNOSIS — I10 Essential (primary) hypertension: Secondary | ICD-10-CM

## 2018-12-12 LAB — CBC WITH DIFFERENTIAL/PLATELET
Basophils Absolute: 0.1 10*3/uL (ref 0.0–0.1)
Basophils Relative: 1 % (ref 0.0–3.0)
Eosinophils Absolute: 0.2 10*3/uL (ref 0.0–0.7)
Eosinophils Relative: 4 % (ref 0.0–5.0)
HCT: 40.4 % (ref 36.0–46.0)
Hemoglobin: 13.3 g/dL (ref 12.0–15.0)
Lymphocytes Relative: 25.6 % (ref 12.0–46.0)
Lymphs Abs: 1.5 10*3/uL (ref 0.7–4.0)
MCHC: 32.9 g/dL (ref 30.0–36.0)
MCV: 90 fl (ref 78.0–100.0)
Monocytes Absolute: 0.5 10*3/uL (ref 0.1–1.0)
Monocytes Relative: 8.9 % (ref 3.0–12.0)
Neutro Abs: 3.4 10*3/uL (ref 1.4–7.7)
Neutrophils Relative %: 60.5 % (ref 43.0–77.0)
Platelets: 188 10*3/uL (ref 150.0–400.0)
RBC: 4.49 Mil/uL (ref 3.87–5.11)
RDW: 15.1 % (ref 11.5–15.5)
WBC: 5.7 10*3/uL (ref 4.0–10.5)

## 2018-12-12 LAB — COMPREHENSIVE METABOLIC PANEL
ALT: 11 U/L (ref 0–35)
AST: 16 U/L (ref 0–37)
Albumin: 4.3 g/dL (ref 3.5–5.2)
Alkaline Phosphatase: 86 U/L (ref 39–117)
BUN: 25 mg/dL — ABNORMAL HIGH (ref 6–23)
CO2: 30 mEq/L (ref 19–32)
Calcium: 10.7 mg/dL — ABNORMAL HIGH (ref 8.4–10.5)
Chloride: 104 mEq/L (ref 96–112)
Creatinine, Ser: 1.08 mg/dL (ref 0.40–1.20)
GFR: 48.34 mL/min — ABNORMAL LOW (ref >60.00)
Glucose, Bld: 114 mg/dL — ABNORMAL HIGH (ref 70–99)
Potassium: 5 mEq/L (ref 3.5–5.1)
Sodium: 142 mEq/L (ref 135–145)
Total Bilirubin: 0.9 mg/dL (ref 0.2–1.2)
Total Protein: 7.6 g/dL (ref 6.0–8.3)

## 2018-12-12 LAB — TSH: TSH: 1.72 u[IU]/mL (ref 0.35–4.50)

## 2018-12-12 LAB — LIPID PANEL
Cholesterol: 215 mg/dL — ABNORMAL HIGH (ref 0–200)
HDL: 76.2 mg/dL (ref >39.00)
LDL Cholesterol: 108 mg/dL — ABNORMAL HIGH (ref 0–99)
NonHDL: 138.51
Total CHOL/HDL Ratio: 3
Triglycerides: 155 mg/dL — ABNORMAL HIGH (ref 0.0–149.0)
VLDL: 31 mg/dL (ref 0.0–40.0)

## 2018-12-12 LAB — HEMOGLOBIN A1C: Hgb A1c MFr Bld: 5.9 % (ref 4.6–6.5)

## 2018-12-12 NOTE — Assessment & Plan Note (Signed)
Pain going down left leg Has lateral hip pain and some lower back ache She is no longer taking the gabapentin and would like to hold off on restarting for now Will refer to Dr. Tamala Julian for further evaluation and treatment Can continue Tylenol or Aleve as needed

## 2018-12-12 NOTE — Assessment & Plan Note (Signed)
Check a1c Low sugar / carb diet Stressed regular exercise   

## 2018-12-12 NOTE — Assessment & Plan Note (Signed)
Left kidney does have a small stone and a cyst, which seem less likely the cause for this pain May be muscular skeletal-related to arthritis or radiculopathy from the thoracic spine Deferred retrying gabapentin Symptomatic treatment

## 2018-12-12 NOTE — Assessment & Plan Note (Signed)
Not currently on a statin Check lipid panel, CMP

## 2018-12-12 NOTE — Assessment & Plan Note (Signed)
BP well controlled at home, likely has white coat htn - elevated here Current regimen effective and well tolerated Continue current medications at current doses Cmp, tsh, cbc, lipid

## 2018-12-13 ENCOUNTER — Encounter: Payer: Self-pay | Admitting: Internal Medicine

## 2018-12-17 NOTE — Telephone Encounter (Signed)
Labs mailed

## 2019-01-08 ENCOUNTER — Ambulatory Visit: Payer: Medicare Other | Admitting: Family Medicine

## 2019-01-09 ENCOUNTER — Other Ambulatory Visit: Payer: Self-pay

## 2019-01-09 ENCOUNTER — Ambulatory Visit (INDEPENDENT_AMBULATORY_CARE_PROVIDER_SITE_OTHER): Payer: Medicare Other | Admitting: Family Medicine

## 2019-01-09 ENCOUNTER — Encounter: Payer: Self-pay | Admitting: Family Medicine

## 2019-01-09 ENCOUNTER — Ambulatory Visit: Payer: Self-pay

## 2019-01-09 VITALS — BP 124/70 | HR 75 | Ht 63.0 in | Wt 152.0 lb

## 2019-01-09 DIAGNOSIS — M25552 Pain in left hip: Secondary | ICD-10-CM

## 2019-01-09 DIAGNOSIS — M7062 Trochanteric bursitis, left hip: Secondary | ICD-10-CM | POA: Diagnosis not present

## 2019-01-09 NOTE — Assessment & Plan Note (Signed)
Injection given today.  Patient does have some degenerative scoliosis that could be contributing, discussed icing regimen and home exercise, discussed topical anti-inflammatories.  Follow-up again in 4 to 8 weeks.

## 2019-01-09 NOTE — Patient Instructions (Signed)
Pennsaid Exercises 3x a week Vitamin D 2000IU daily Tart cherry 1200 mg at night See me in 4 weeks

## 2019-01-09 NOTE — Progress Notes (Signed)
[facility] [address] [place], [place] [address] Phone: [phone] Subjective:   I Ronelle Nigh am serving as a Neurosurgeon for Dr. Antoine Primas.   CC: Left leg and hip pain  [id number]  Barbara [name] is a 85 y.o. female coming in with complaint of left leg and hip pain. States she usually walks about 3 miles every morning. Was told the pain could be bursitis or sciatic nerve related. Also left side pain. States she has a kidney stone and 2 cyst and believes the pain is coming from it. History of back surgery in the 2000s for a herniated disc. Pain radiates anterior the the tibialis anterior.   Onset- Chronic Location - lateral Duration-  Character-  Aggravating factors- walking, hip flexion, crossing legs Reliving factors-  Therapies tried- Voltaren gel, ice  Severity- 9-10/10 at its worse     Past Medical History:  Diagnosis Date  . Aneurysm, ophthalmic artery   . Colonic polyp   . Diverticulosis   . Hypertension   . Urine incontinence    Past Surgical History:  Procedure Laterality Date  . disk removal     Social History   Socioeconomic History  . Marital status: Married    Spouse name: Not on file  . Number of children: Not on file  . Years of education: Not on file  . Highest education level: Not on file  Occupational History  . Not on file  Social Needs  . Financial resource strain: Not on file  . Food insecurity    Worry: Not on file    Inability: Not on file  . Transportation needs    Medical: Not on file    Non-medical: Not on file  Tobacco Use  . Smoking status: Former Games developer  . Smokeless tobacco: Former Engineer, water and Sexual Activity  . Alcohol use: No  . Drug use: No  . Sexual activity: Not on file  Lifestyle  . Physical activity    Days per week: Not on file    Minutes per session: Not on file  . Stress: Not on file  Relationships  . Social Musician on phone: Not on file    Gets  together: Not on file    Attends religious service: Not on file    Active member of club or organization: Not on file    Attends meetings of clubs or organizations: Not on file    Relationship status: Not on file  Other Topics Concern  . Not on file  Social History Narrative  . Not on file   Allergies  Allergen Reactions  . Amlodipine Swelling    Swelling at 5 mg  . Metoprolol Other (See Comments)    Felt off balance  . Telmisartan Other (See Comments)    jitteriness   No family history on file.   Current Outpatient Medications (Cardiovascular):  .  benazepril (LOTENSIN) 5 MG tablet, Take 1 tablet (5 mg total) by mouth daily.  Current Outpatient Medications (Respiratory):  .  albuterol (PROVENTIL HFA;VENTOLIN HFA) 108 (90 Base) MCG/ACT inhaler, Inhale 2 puffs into the lungs every 6 (six) hours as needed for wheezing or shortness of breath.    Current Outpatient Medications (Other):  Marland Kitchen  Calcium-Vitamin D-Vitamin K 500-500-40 MG-UNT-MCG CHEW, Chew by mouth. .  diclofenac sodium (VOLTAREN) 1 % GEL,  .  fish oil-omega-3 fatty acids 1000 MG capsule, Take 2 g by mouth daily. Gerrianne Scale (MSM  PO), Take by mouth. .  NON FORMULARY, Slimfast powder .  Nutritional Supplements (ANTI-INFLAMMATORY ENZYME) CAPS, APPLY 1 2 GMS TO AFFECTED AREA 3 4 TIMES DAILY AS NEEDED FOR PAIN  THIS IS A COMPOUNDED CREAM 1 PUMP EQUALS 1 GRAM    Past medical history, social, surgical and family history all reviewed in electronic medical record.  No pertanent information unless stated regarding to the chief complaint.   Review of Systems:  No headache, visual changes, nausea, vomiting, diarrhea, constipation, dizziness, abdominal pain, skin rash, fevers, chills, night sweats, weight loss, swollen lymph nodes, body aches, joint swelling,chest pain, shortness of breath, mood changes.  Positive muscle aches  Objective  Blood pressure 124/70, pulse 75, height 5\' 3"  (1.6 m), weight 152 lb (68.9  kg), SpO2 97 %.     General: No apparent distress alert and oriented x3 mood and affect normal, dressed appropriately.  HEENT: Pupils equal, extraocular movements intact  Respiratory: Patient's speak in full sentences and does not appear short of breath  Cardiovascular: No lower extremity edema, non tender, no erythema  Skin: Warm dry intact with no signs of infection or rash on extremities or on axial skeleton.  Abdomen: Soft nontender  Neuro: Cranial nerves II through XII are intact, neurovascularly intact in all extremities with 2+ DTRs and 2+ pulses.  Lymph: No lymphadenopathy of posterior or anterior cervical chain or axillae bilaterally.  Gait antalgic gait MSK:  tender with limited range of motion and stability and strength and tone of shoulders, elbows, wrist,  knee and ankles bilaterally.  Arthritic changes of multiple joints Left hip shows the patient does have more of a positive Trendelenburg, weakness noted of the hip joint itself.  But patient does have a positive Pearlean BrownieFaber with pain over the greater trochanteric area.  Full internal range of motion noted, negative straight leg test.  Mild diffuse tenderness in the paraspinal musculature of the lumbar spine.   Procedure: Real-time Ultrasound Guided Injection of left  greater trochanteric bursitis secondary to patient's body habitus Device: GE Logiq Q7  Ultrasound guided injection is preferred based studies that show increased duration, increased effect, greater accuracy, decreased procedural pain, increased response rate, and decreased cost with ultrasound guided versus blind injection.  Verbal informed consent obtained.  Time-out conducted.  Noted no overlying erythema, induration, or other signs of local infection.  Skin prepped in a sterile fashion.  Local anesthesia: Topical Ethyl chloride.  With sterile technique and under real time ultrasound guidance:  Greater trochanteric area was visualized and patient's bursa was noted. A  22-gauge 3 inch needle was inserted and 4 cc of 0.5% Marcaine and 1 cc of Kenalog 40 mg/dL was injected. Pictures taken Completed without difficulty  Pain immediately resolved suggesting accurate placement of the medication.  Advised to call if fevers/chills, erythema, induration, drainage, or persistent bleeding.  Images permanently stored and available for review in the ultrasound unit.  Impression: Technically successful ultrasound guided injection.  [id number]; 15 additional minutes spent for Therapeutic exercises as stated in above notes.  This included exercises focusing on stretching, strengthening, with significant focus on eccentric aspects.   Long term goals include an improvement in range of motion, strength, endurance as well as avoiding reinjury. Patient's frequency would include in 1-2 times a day, 3-5 times a week for a duration of 6-12 weeks. Hip strengthening exercises which included:  Pelvic tilt/bracing to help with proper recruitment of the lower abs and pelvic floor muscles  Glute strengthening to properly contract glutes without  over-engaging low back and hamstrings - prone hip extension and glute bridge exercises Proper stretching techniques to increase effectiveness for the hip flexors, groin, quads, piriformic and low back when appropriate    Proper technique shown and discussed handout in great detail with ATC.  All questions were discussed and answered.     Impression and Recommendations:     This case required medical decision making of moderate complexity. The above documentation has been reviewed and is accurate and complete Lyndal Pulley, DO       Note: This dictation was prepared with Dragon dictation along with smaller phrase technology. Any transcriptional errors that result from this process are unintentional.

## 2019-02-06 ENCOUNTER — Other Ambulatory Visit: Payer: Self-pay

## 2019-02-06 ENCOUNTER — Encounter: Payer: Self-pay | Admitting: Family Medicine

## 2019-02-06 ENCOUNTER — Ambulatory Visit (INDEPENDENT_AMBULATORY_CARE_PROVIDER_SITE_OTHER): Payer: Medicare Other | Admitting: Family Medicine

## 2019-02-06 DIAGNOSIS — M7062 Trochanteric bursitis, left hip: Secondary | ICD-10-CM | POA: Diagnosis not present

## 2019-02-06 NOTE — Patient Instructions (Signed)
Good to see you Continue the exercises and the vitamins  See me again in 2 months

## 2019-02-06 NOTE — Progress Notes (Signed)
[facility] [address] [place], [place] [address] Phone: [phone] Subjective:   I Ronelle Nigh am serving as a Neurosurgeon for Dr. Antoine Primas.   CC: Left hip pain  [id number]   01/09/2019 Injection given today.  Patient does have some degenerative scoliosis that could be contributing, discussed icing regimen and home exercise, discussed topical anti-inflammatories.  Follow-up again in 4 to 8 weeks.  02/06/2019 Julionna Heyer is a 85 y.o. female coming in with complaint of left hip pain. Patient states she still has some pain. Pain is not sharp anymore but mostly achy. Has been exercising and walking more. Using Pennsaid.   Patient was given an injection at last follow-up.    Past Medical History:  Diagnosis Date  . Aneurysm, ophthalmic artery   . Colonic polyp   . Diverticulosis   . Hypertension   . Urine incontinence    Past Surgical History:  Procedure Laterality Date  . disk removal     Social History   Socioeconomic History  . Marital status: Married    Spouse name: Not on file  . Number of children: Not on file  . Years of education: Not on file  . Highest education level: Not on file  Occupational History  . Not on file  Social Needs  . Financial resource strain: Not on file  . Food insecurity    Worry: Not on file    Inability: Not on file  . Transportation needs    Medical: Not on file    Non-medical: Not on file  Tobacco Use  . Smoking status: Former Games developer  . Smokeless tobacco: Former Engineer, water and Sexual Activity  . Alcohol use: No  . Drug use: No  . Sexual activity: Not on file  Lifestyle  . Physical activity    Days per week: Not on file    Minutes per session: Not on file  . Stress: Not on file  Relationships  . Social Musician on phone: Not on file    Gets together: Not on file    Attends religious service: Not on file    Active member of club or organization: Not on file   Attends meetings of clubs or organizations: Not on file    Relationship status: Not on file  Other Topics Concern  . Not on file  Social History Narrative  . Not on file   Allergies  Allergen Reactions  . Amlodipine Swelling    Swelling at 5 mg  . Metoprolol Other (See Comments)    Felt off balance  . Telmisartan Other (See Comments)    jitteriness   No family history on file.   Current Outpatient Medications (Cardiovascular):  .  benazepril (LOTENSIN) 5 MG tablet, Take 1 tablet (5 mg total) by mouth daily.  Current Outpatient Medications (Respiratory):  .  albuterol (PROVENTIL HFA;VENTOLIN HFA) 108 (90 Base) MCG/ACT inhaler, Inhale 2 puffs into the lungs every 6 (six) hours as needed for wheezing or shortness of breath.    Current Outpatient Medications (Other):  Marland Kitchen  Calcium-Vitamin D-Vitamin K 500-500-40 MG-UNT-MCG CHEW, Chew by mouth. .  diclofenac sodium (VOLTAREN) 1 % GEL,  .  fish oil-omega-3 fatty acids 1000 MG capsule, Take 2 g by mouth daily. .  Methylsulfonylmethane (MSM PO), Take by mouth. .  NON FORMULARY, Slimfast powder .  Nutritional Supplements (ANTI-INFLAMMATORY ENZYME) CAPS, APPLY 1 2 GMS TO AFFECTED AREA 3 4 TIMES DAILY AS NEEDED  FOR PAIN  THIS IS A COMPOUNDED CREAM 1 PUMP EQUALS 1 GRAM    Past medical history, social, surgical and family history all reviewed in electronic medical record.  No pertanent information unless stated regarding to the chief complaint.   Review of Systems:  No headache, visual changes, nausea, vomiting, diarrhea, constipation, dizziness, abdominal pain, skin rash, fevers, chills, night sweats, weight loss, swollen lymph nodes, body aches, joint swelling,chest pain, shortness of breath, mood changes.  Positive muscle aches  Objective  Blood pressure 120/60, pulse 79, height 5\' 3"  (1.6 m), weight 149 lb (67.6 kg), SpO2 94 %.    General: No apparent distress alert and oriented x3 mood and affect normal, dressed appropriately.   HEENT: Pupils equal, extraocular movements intact  Respiratory: Patient's speak in full sentences and does not appear short of breath  Cardiovascular: No lower extremity edema, non tender, no erythema  Skin: Warm dry intact with no signs of infection or rash on extremities or on axial skeleton.  Abdomen: Soft nontender  Neuro: Cranial nerves II through XII are intact, neurovascularly intact in all extremities with 2+ DTRs and 2+ pulses.  Lymph: No lymphadenopathy of posterior or anterior cervical chain or axillae bilaterally.  Gait normal with good balance and coordination.  MSK:  tender with limited range of motion and good stability and symmetric strength and tone of shoulders, elbows, wrist, knee and ankles bilaterally.   Left hip exam does have some mild limited range of motion.  Minimal tender to palpation over the greater trochanteric area.  Negative straight leg test.   Impression and Recommendations:     . The above documentation has been reviewed and is accurate and complete Lyndal Pulley, DO       Note: This dictation was prepared with Dragon dictation along with smaller phrase technology. Any transcriptional errors that result from this process are unintentional.

## 2019-02-06 NOTE — Assessment & Plan Note (Signed)
Patient doing very well after the injection.  Discussed icing regimen and home exercises, which activities to do which wants to avoid.  Patient as long as doing well we will continue with conservative therapy and follow-up with me again 2 months

## 2019-04-02 ENCOUNTER — Ambulatory Visit: Payer: Medicare Other | Attending: Internal Medicine

## 2019-04-02 DIAGNOSIS — Z23 Encounter for immunization: Secondary | ICD-10-CM

## 2019-04-02 NOTE — Progress Notes (Signed)
   Covid-19 Vaccination Clinic  Name:  Illana Nolting    MRN: [id number] DOB: 04-27-1932  04/02/2019  Ms. Haralson was observed post Covid-19 immunization for 15 minutes without incidence. She was provided with Vaccine Information Sheet and instruction to access the V-Safe system.   Ms. Tredway was instructed to call 911 with any severe reactions post vaccine: Marland Kitchen Difficulty breathing  . Swelling of your face and throat  . A fast heartbeat  . A bad rash all over your body  . Dizziness and weakness    Immunizations Administered    Name Date Dose VIS Date Route   Pfizer COVID-19 Vaccine 04/02/2019 11:58 AM 0.3 mL 02/22/2019 Intramuscular   Manufacturer: [employer], [employer]   Lot: [id number]   NDC: [address]-7

## 2019-04-10 ENCOUNTER — Ambulatory Visit: Payer: Medicare Other | Admitting: Family Medicine

## 2019-04-22 ENCOUNTER — Ambulatory Visit: Payer: Medicare Other | Attending: Internal Medicine

## 2019-04-22 DIAGNOSIS — Z23 Encounter for immunization: Secondary | ICD-10-CM | POA: Insufficient documentation

## 2019-04-22 NOTE — Progress Notes (Signed)
   Covid-19 Vaccination Clinic  Name:  Barbara [name]    MRN: [id number] DOB: [birth year]  04/22/2019  Barbara [name] was observed post Covid-19 immunization for 15 minutes without incidence. She was provided with Vaccine Information Sheet and instruction to access the V-Safe system.   Barbara [name] was instructed to call 911 with any severe reactions post vaccine: Marland Kitchen Difficulty breathing  . Swelling of your face and throat  . A fast heartbeat  . A bad rash all over your body  . Dizziness and weakness    Immunizations Administered    Name Date Dose VIS Date Route   Pfizer COVID-19 Vaccine 04/22/2019 10:47 AM 0.3 mL 02/22/2019 Intramuscular   Manufacturer: [employer], [employer]   Lot: [id number]   NDC: [address]-4

## 2019-05-25 ENCOUNTER — Other Ambulatory Visit: Payer: Self-pay | Admitting: Internal Medicine

## 2019-06-09 NOTE — Progress Notes (Signed)
Subjective:    Patient ID: Barbara [name], female    DOB: 1932-12-18, 86 y.o.   MRN: [id number]  HPI The patient is here for follow up of their chronic medical problems, including hypertension, prediabetes, osteoporosis  She is taking all of her medications as prescribed.    She is exercising regularly.   She is walking some but not as much as she was.   She still has the left mid back pain.  The pain is constant.  It is worse with certain movements.  She takes tylenol, but it does not seem to help.  She denies N/T.    She denies urinary symptoms.    Medications and allergies reviewed with patient and updated if appropriate.  Patient Active Problem List   Diagnosis Date Noted  . BPPV (benign paroxysmal positional vertigo), right 07/24/2018  . Left lumbar radiculopathy 05/09/2018  . Erythema of skin 05/09/2018  . Sensorineural hearing loss (SNHL), bilateral 01/31/2018  . Trochanteric bursitis of left hip 01/18/2018  . Nephrolithiasis 01/17/2018  . Dizziness 01/01/2018  . Left-sided thoracic back pain 01/01/2018  . Fluttering heart 12/06/2016  . Prediabetes 06/25/2016  . Aortic atherosclerosis ([place]) 06/22/2016  . Osteoporosis 06/22/2016  . Bilateral primary osteoarthritis of first carpometacarpal joints 08/17/2015  . Hypertension 03/18/2015  . Temporal arteritis ([place]) 01/05/2015  . Aneurysm, ophthalmic artery 12/11/2014    Current Outpatient Medications on File Prior to Visit  Medication Sig Dispense Refill  . albuterol (PROVENTIL HFA;VENTOLIN HFA) 108 (90 Base) MCG/ACT inhaler Inhale 2 puffs into the lungs every 6 (six) hours as needed for wheezing or shortness of breath. 1 Inhaler 0  . benazepril (LOTENSIN) 5 MG tablet TAKE 1 TABLET EACH DAY. 90 tablet 0  . Calcium-Vitamin D-Vitamin K 528-413-24 MG-UNT-MCG CHEW Chew by mouth.    . diclofenac sodium (VOLTAREN) 1 % GEL     . fish oil-omega-3 fatty acids 1000 MG capsule Take 2 g by mouth daily.    . Methylsulfonylmethane  (MSM PO) Take by mouth.    . NON FORMULARY Slimfast powder    . Nutritional Supplements (ANTI-INFLAMMATORY ENZYME) CAPS APPLY 1 2 GMS TO AFFECTED AREA 3 4 TIMES DAILY AS NEEDED FOR PAIN  THIS IS A COMPOUNDED CREAM 1 PUMP EQUALS 1 GRAM  1   No current facility-administered medications on file prior to visit.    Past Medical History:  Diagnosis Date  . Aneurysm, ophthalmic artery   . Colonic polyp   . Diverticulosis   . Hypertension   . Urine incontinence     Past Surgical History:  Procedure Laterality Date  . disk removal      Social History   Socioeconomic History  . Marital status: Married    Spouse name: Not on file  . Number of children: Not on file  . Years of education: Not on file  . Highest education level: Not on file  Occupational History  . Not on file  Tobacco Use  . Smoking status: Former Research scientist (life sciences)  . Smokeless tobacco: Former Network engineer and Sexual Activity  . Alcohol use: No  . Drug use: No  . Sexual activity: Not on file  Other Topics Concern  . Not on file  Social History Narrative  . Not on file   Social Determinants of Health   Financial Resource Strain:   . Difficulty of Paying Living Expenses:   Food Insecurity:   . Worried About Charity fundraiser in the Last Year:   .  Ran Out of Food in the Last Year:   Transportation Needs:   . Freight forwarder (Medical):   Marland Kitchen Lack of Transportation (Non-Medical):   Physical Activity:   . Days of Exercise per Week:   . Minutes of Exercise per Session:   Stress:   . Feeling of Stress :   Social Connections:   . Frequency of Communication with Friends and Family:   . Frequency of Social Gatherings with Friends and Family:   . Attends Religious Services:   . Active Member of Clubs or Organizations:   . Attends Banker Meetings:   Marland Kitchen Marital Status:     History reviewed. No pertinent family history.  Review of Systems  Constitutional: Negative for chills and fever.    Respiratory: Positive for cough (occ, dry ). Negative for shortness of breath and wheezing.   Cardiovascular: Negative for chest pain, palpitations and leg swelling.  Genitourinary: Negative for dysuria and hematuria.  Musculoskeletal: Positive for arthralgias and back pain.  Neurological: Negative for light-headedness and headaches.       Objective:   Vitals:   06/10/19 0905 06/10/19 0948  BP: (!) 154/78 140/70  Pulse: 77   Resp: 16   Temp: 97.6 F (36.4 C)   SpO2: 99%    BP Readings from Last 3 Encounters:  06/10/19 140/70  02/06/19 120/60  01/09/19 124/70   Wt Readings from Last 3 Encounters:  06/10/19 154 lb (69.9 kg)  02/06/19 149 lb (67.6 kg)  01/09/19 152 lb (68.9 kg)   Body mass index is 28.63 kg/m.   Physical Exam    Constitutional: Appears well-developed and well-nourished. No distress.  HENT:  Head: Normocephalic and atraumatic.  Neck: Neck supple. No tracheal deviation present. No thyromegaly present.  No cervical lymphadenopathy Cardiovascular: Normal rate, regular rhythm and normal heart sounds.   No murmur heard. No carotid bruit .  No edema Pulmonary/Chest: Effort normal and breath sounds normal. No respiratory distress. No has no wheezes. No rales.  Msk:  Possible mild scoliosis of T spine.  Left mid back non tender with palpation, no deformity Skin: Skin is warm and dry. Not diaphoretic.  Psychiatric: Normal mood and affect. Behavior is normal.      Assessment & Plan:    See Problem List for Assessment and Plan of chronic medical problems.    This visit occurred during the SARS-CoV-2 public health emergency.  Safety protocols were in place, including screening questions prior to the visit, additional usage of staff PPE, and extensive cleaning of exam room while observing appropriate contact time as indicated for disinfecting solutions.

## 2019-06-09 NOTE — Patient Instructions (Addendum)
  Blood work was ordered.     Think about have a bone density test.     Medications reviewed and updated.  Changes include :   none   Follow up with Dr Katrinka Blazing for your hip and left mid back pain   Please followup in 6 months

## 2019-06-10 ENCOUNTER — Other Ambulatory Visit: Payer: Self-pay

## 2019-06-10 ENCOUNTER — Encounter: Payer: Self-pay | Admitting: Internal Medicine

## 2019-06-10 ENCOUNTER — Ambulatory Visit (INDEPENDENT_AMBULATORY_CARE_PROVIDER_SITE_OTHER): Payer: Medicare Other | Admitting: Internal Medicine

## 2019-06-10 VITALS — BP 140/70 | HR 77 | Temp 97.6°F | Resp 16 | Ht 61.5 in | Wt 154.0 lb

## 2019-06-10 DIAGNOSIS — R7303 Prediabetes: Secondary | ICD-10-CM | POA: Diagnosis not present

## 2019-06-10 DIAGNOSIS — M546 Pain in thoracic spine: Secondary | ICD-10-CM

## 2019-06-10 DIAGNOSIS — M810 Age-related osteoporosis without current pathological fracture: Secondary | ICD-10-CM

## 2019-06-10 DIAGNOSIS — I10 Essential (primary) hypertension: Secondary | ICD-10-CM

## 2019-06-10 LAB — LIPID PANEL
Cholesterol: 216 mg/dL — ABNORMAL HIGH (ref 0–200)
HDL: 68.7 mg/dL (ref >39.00)
LDL Cholesterol: 120 mg/dL — ABNORMAL HIGH (ref 0–99)
NonHDL: 147.17
Total CHOL/HDL Ratio: 3
Triglycerides: 135 mg/dL (ref 0.0–149.0)
VLDL: 27 mg/dL (ref 0.0–40.0)

## 2019-06-10 LAB — COMPREHENSIVE METABOLIC PANEL
ALT: 11 U/L (ref 0–35)
AST: 16 U/L (ref 0–37)
Albumin: 4.1 g/dL (ref 3.5–5.2)
Alkaline Phosphatase: 81 U/L (ref 39–117)
BUN: 27 mg/dL — ABNORMAL HIGH (ref 6–23)
CO2: 29 mEq/L (ref 19–32)
Calcium: 10.1 mg/dL (ref 8.4–10.5)
Chloride: 102 mEq/L (ref 96–112)
Creatinine, Ser: 1.08 mg/dL (ref 0.40–1.20)
GFR: 48.28 mL/min — ABNORMAL LOW (ref >60.00)
Glucose, Bld: 116 mg/dL — ABNORMAL HIGH (ref 70–99)
Potassium: 4.1 mEq/L (ref 3.5–5.1)
Sodium: 140 mEq/L (ref 135–145)
Total Bilirubin: 1 mg/dL (ref 0.2–1.2)
Total Protein: 7.1 g/dL (ref 6.0–8.3)

## 2019-06-10 LAB — VITAMIN D 25 HYDROXY (VIT D DEFICIENCY, FRACTURES): VITD: 61.62 ng/mL (ref 30.00–100.00)

## 2019-06-10 LAB — HEMOGLOBIN A1C: Hgb A1c MFr Bld: 5.6 % (ref 4.6–6.5)

## 2019-06-10 NOTE — Assessment & Plan Note (Addendum)
Chronic BP well controlled at home, slightly elevated here - she did get lost coming here - better on repeat Current regimen effective and well tolerated Continue current medications at current doses cmp

## 2019-06-10 NOTE — Assessment & Plan Note (Addendum)
Chronic  dexa due - deferred for now - advised her to think about having the dexa scan and high risk of fracture with minimal trauma Taking calcium, vitamin d Check vitamin d level Exercising regularly - advised increasing her walking

## 2019-06-10 NOTE — Assessment & Plan Note (Addendum)
Chronic Check a1c, cmp, lipids Low sugar / carb diet Stressed regular exercise

## 2019-06-10 NOTE — Assessment & Plan Note (Signed)
Chronic She is still having left mid back pain It sounds msk in nature. I do not think the small renal cyst is causing her pain Discussed t spine xray  - she looks like she may have some scoliosis which may be causing the pain pain, discussing with Dr Katrinka Blazing and re-imaging her kidneys to check on the cyst She will see Dr Katrinka Blazing

## 2019-06-12 ENCOUNTER — Encounter: Payer: Self-pay | Admitting: Internal Medicine

## 2019-06-19 ENCOUNTER — Ambulatory Visit (INDEPENDENT_AMBULATORY_CARE_PROVIDER_SITE_OTHER): Payer: Medicare Other

## 2019-06-19 ENCOUNTER — Ambulatory Visit: Payer: Medicare Other | Admitting: Family Medicine

## 2019-06-19 ENCOUNTER — Other Ambulatory Visit: Payer: Self-pay

## 2019-06-19 ENCOUNTER — Encounter: Payer: Self-pay | Admitting: Family Medicine

## 2019-06-19 VITALS — BP 124/72 | HR 80 | Ht 61.0 in | Wt 151.0 lb

## 2019-06-19 DIAGNOSIS — G8929 Other chronic pain: Secondary | ICD-10-CM

## 2019-06-19 DIAGNOSIS — M25552 Pain in left hip: Secondary | ICD-10-CM | POA: Diagnosis not present

## 2019-06-19 DIAGNOSIS — M7062 Trochanteric bursitis, left hip: Secondary | ICD-10-CM | POA: Diagnosis not present

## 2019-06-19 DIAGNOSIS — M546 Pain in thoracic spine: Secondary | ICD-10-CM | POA: Diagnosis not present

## 2019-06-19 DIAGNOSIS — M545 Low back pain: Secondary | ICD-10-CM | POA: Diagnosis not present

## 2019-06-19 MED ORDER — GABAPENTIN 100 MG PO CAPS: 100.0000 mg | ORAL_CAPSULE | Freq: Every day | ORAL | 3 refills | Status: DC

## 2019-06-19 NOTE — Assessment & Plan Note (Signed)
Chronic problem with exacerbation.  Repeat injection today.  Topical anti-inflammatories encourage, and increase activity, sent to formal physical therapy that I think will be beneficial and started on a low dose of gabapentin for the differential lumbar radiculopathy.  Patient will try these changes and see me again in 6 to 8 weeks

## 2019-06-19 NOTE — Patient Instructions (Addendum)
Gabapentin 100 mg at night PT will call you Start exercises in 24 hours See me again in 8-10 weeks

## 2019-06-19 NOTE — Progress Notes (Signed)
[place] [facility] [address] [place] Phone: [phone] Subjective:   I Barbara [name] am serving as a Education administrator for Dr. Hulan Saas.  This visit occurred during the SARS-CoV-2 public health emergency.  Safety protocols were in place, including screening questions prior to the visit, additional usage of staff PPE, and extensive cleaning of exam room while observing appropriate contact time as indicated for disinfecting solutions.   I'm seeing this patient by the request  of:  Binnie Rail, MD  CC: Hip pain follow-up  [id number]   02/06/2019 Patient doing very well after the injection.  Discussed icing regimen and home exercises, which activities to do which wants to avoid.  Patient as long as doing well we will continue with conservative therapy and follow-up with me again 2 months  Update 06/19/2019 Barbara [name] is a 86 y.o. female coming in with complaint of left hip pain. Patient states the hip is still sore. States it was doing well for a while and that she may have exercised too much. Also mid back pain as well. States there is something going on with her spine.  Patient does feel like she got near complete resolution of pain for 6 to 8 weeks before the pain started coming back.  Has tried to increase activity somewhat.       Past Medical History:  Diagnosis Date  . Aneurysm, ophthalmic artery   . Colonic polyp   . Diverticulosis   . Hypertension   . Urine incontinence    Past Surgical History:  Procedure Laterality Date  . disk removal     Social History   Socioeconomic History  . Marital status: Married    Spouse name: Not on file  . Number of children: Not on file  . Years of education: Not on file  . Highest education level: Not on file  Occupational History  . Not on file  Tobacco Use  . Smoking status: Former Research scientist (life sciences)  . Smokeless tobacco: Former Network engineer and Sexual Activity  . Alcohol use: No  . Drug  use: No  . Sexual activity: Not on file  Other Topics Concern  . Not on file  Social History Narrative  . Not on file   Social Determinants of Health   Financial Resource Strain:   . Difficulty of Paying Living Expenses:   Food Insecurity:   . Worried About Charity fundraiser in the Last Year:   . Arboriculturist in the Last Year:   Transportation Needs:   . Film/video editor (Medical):   Marland Kitchen Lack of Transportation (Non-Medical):   Physical Activity:   . Days of Exercise per Week:   . Minutes of Exercise per Session:   Stress:   . Feeling of Stress :   Social Connections:   . Frequency of Communication with Friends and Family:   . Frequency of Social Gatherings with Friends and Family:   . Attends Religious Services:   . Active Member of Clubs or Organizations:   . Attends Archivist Meetings:   Marland Kitchen Marital Status:    Allergies  Allergen Reactions  . Amlodipine Swelling    Swelling at 5 mg  . Fosamax [Alendronate]     gerd  . Metoprolol Other (See Comments)    Felt off balance  . Telmisartan Other (See Comments)    jitteriness   No family history on file.   Current Outpatient Medications (Cardiovascular):  .  benazepril (LOTENSIN) 5 MG tablet, TAKE 1 TABLET EACH DAY.     Current Outpatient Medications (Other):  Marland Kitchen  Calcium-Vitamin D-Vitamin K 500-500-40 MG-UNT-MCG CHEW, Chew by mouth. .  diclofenac sodium (VOLTAREN) 1 % GEL,  .  fish oil-omega-3 fatty acids 1000 MG capsule, Take 2 g by mouth daily. .  Methylsulfonylmethane (MSM PO), Take by mouth. .  NON FORMULARY, Slimfast powder .  Nutritional Supplements (ANTI-INFLAMMATORY ENZYME) CAPS, APPLY 1 2 GMS TO AFFECTED AREA 3 4 TIMES DAILY AS NEEDED FOR PAIN  THIS IS A COMPOUNDED CREAM 1 PUMP EQUALS 1 GRAM .  gabapentin (NEURONTIN) 100 MG capsule, Take 1 capsule (100 mg total) by mouth at bedtime.   Reviewed prior external information including notes and imaging from  primary care provider As well  as notes that were available from care everywhere and other healthcare systems.  Past medical history, social, surgical and family history all reviewed in electronic medical record.  No pertanent information unless stated regarding to the chief complaint.   Review of Systems:  No headache, visual changes, nausea, vomiting, diarrhea, constipation, dizziness, abdominal pain, skin rash, fevers, chills, night sweats, weight loss, swollen lymph nodes, , joint swelling, chest pain, shortness of breath, mood changes. POSITIVE muscle aches, body aches  Objective  Blood pressure 124/72, pulse 80, height 5\' 1"  (1.549 m), weight 151 lb (68.5 kg), SpO2 97 %.   General: No apparent distress alert and oriented x3 mood and affect normal, dressed appropriately.  HEENT: Pupils equal, extraocular movements intact  Respiratory: Patient's speak in full sentences and does not appear short of breath  Cardiovascular: No lower extremity edema, non tender, no erythema  Neuro: Cranial nerves II through XII are intact, neurovascularly intact in all extremities with 2+ DTRs and 2+ pulses.  Gait antalgic favoring left hip Left hip exam shows that there is severe tenderness to palpation in the paraspinal musculature of the lumbar spine as well as over the greater trochanteric area.  Difficulty with FABER test.  Negative straight leg test.  Patient does have degenerative scoliosis of the lumbar spine and tightness in the thoracolumbar juncture noted.  4-5 strength in lower extremities but symmetric.   Procedure: Real-time Ultrasound Guided Injection of left  greater trochanteric bursitis secondary to patient's body habitus Device: GE Logiq Q7  Ultrasound guided injection is preferred based studies that show increased duration, increased effect, greater accuracy, decreased procedural pain, increased response rate, and decreased cost with ultrasound guided versus blind injection.  Verbal informed consent obtained.  Time-out  conducted.  Noted no overlying erythema, induration, or other signs of local infection.  Skin prepped in a sterile fashion.  Local anesthesia: Topical Ethyl chloride.  With sterile technique and under real time ultrasound guidance:  Greater trochanteric area was visualized and patient's bursa was noted. A 22-gauge 3 inch needle was inserted and 4 cc of 0.5% Marcaine and 1 cc of Kenalog 40 mg/dL was injected. Pictures taken Completed without difficulty  Pain immediately resolved suggesting accurate placement of the medication.  Advised to call if fevers/chills, erythema, induration, drainage, or persistent bleeding.  Images permanently stored and available for review in the ultrasound unit.  Impression: Technically successful ultrasound guided injection.    Impression and Recommendations:     This case required medical decision making of moderate complexity. The above documentation has been reviewed and is accurate and complete , DO       Note: This dictation was prepared with Dragon dictation along with smaller  Company secretary. Any transcriptional errors that result from this process are unintentional.

## 2019-06-20 ENCOUNTER — Telehealth: Payer: Self-pay | Admitting: Family Medicine

## 2019-06-20 NOTE — Telephone Encounter (Signed)
Spoke with patient per results and to continue plan set forth at visit yesterday.

## 2019-06-20 NOTE — Telephone Encounter (Signed)
Patient would like a call to review the xray from yesterday. I read her Dr Michaelle Copas response but she would like to discuss this further.

## 2019-06-20 NOTE — Telephone Encounter (Signed)
Left message for patient to call back for xray results and questions.

## 2019-07-02 ENCOUNTER — Ambulatory Visit: Payer: Medicare Other | Admitting: Dermatology

## 2019-07-02 ENCOUNTER — Encounter: Payer: Self-pay | Admitting: Dermatology

## 2019-07-02 ENCOUNTER — Other Ambulatory Visit: Payer: Self-pay

## 2019-07-02 DIAGNOSIS — L719 Rosacea, unspecified: Secondary | ICD-10-CM

## 2019-07-02 DIAGNOSIS — L570 Actinic keratosis: Secondary | ICD-10-CM

## 2019-07-02 DIAGNOSIS — D692 Other nonthrombocytopenic purpura: Secondary | ICD-10-CM

## 2019-07-02 DIAGNOSIS — L729 Follicular cyst of the skin and subcutaneous tissue, unspecified: Secondary | ICD-10-CM | POA: Diagnosis not present

## 2019-07-02 NOTE — Progress Notes (Addendum)
   Follow-Up Visit   Subjective  Barbara [name] is a 86 y.o. female who presents for the following: Skin Problem (left scalp a spot raised for years now is tender also left temple itch a friend said to have it checked).  Chief concern is spot left temple Location:  Duration: months Quality: seems stable Associated Signs/Symptoms: Modifying Factors:  Severity:  Timing: Context: friend had skin cancer in same area, suggested this visit.  The following portions of the chart were reviewed this encounter and updated as appropriate: Tobacco  Allergies  Meds  Problems  Med Hx  Surg Hx  Fam Hx      Objective  Well appearing patient in no apparent distress; mood and affect are within normal limits.  All sun exposed areas plus back examined.   Assessment & Plan  AK (actinic keratosis) Left Temple  Destruction of lesion - Left Temple  Destruction method: cryotherapy   Informed consent: discussed and consent obtained   Lesion destroyed using liquid nitrogen: Yes   Region frozen until ice ball extended beyond lesion: Yes   Outcome: patient tolerated procedure well with no complications    Cyst of skin (2) Left Upper Back; Mid Occipital Scalp  Clinically this is a benign exostosis that does not require biopsy or imaging studies, but I encouraged her to discuss the pain with her PCP and to get that clinician's opinion.  Rosacea (2) Left Buccal Cheek ; Right Buccal Cheek   No intervention currently necessary.  Senile purpura ([facility]) (2) Left Forearm - Posterior; Right Forearm - Posterior  Discussed sun damage cause of solar purpura. May choose to try over the counter Dermend daily to affected areas.

## 2019-07-03 ENCOUNTER — Other Ambulatory Visit: Payer: Self-pay

## 2019-07-03 DIAGNOSIS — M25559 Pain in unspecified hip: Secondary | ICD-10-CM

## 2019-07-16 ENCOUNTER — Telehealth: Payer: Self-pay | Admitting: Internal Medicine

## 2019-07-16 NOTE — Telephone Encounter (Signed)
Pt states that she can not take gabapentin due to it making her feel off balance. She is wanting to see if you can send her something else in to help with her arthritis pain. She wants you to take a look at her xrays from Dr. Katrinka Blazing and she wants your opinion on how to manage the pain. Please advise.

## 2019-07-16 NOTE — Telephone Encounter (Signed)
New Message:    Pt is calling and would like a call to discuss her medication. Please advise.

## 2019-07-17 ENCOUNTER — Telehealth: Payer: Self-pay | Admitting: Family Medicine

## 2019-07-17 NOTE — Telephone Encounter (Signed)
She should either f/u with dr Katrinka Blazing or make an appt

## 2019-07-17 NOTE — Telephone Encounter (Signed)
F/u  The patient calling back this morning did not hear back from the office yesterday.   The patient voiced she will be calling Dr. Katrinka Blazing regarding medication.

## 2019-07-17 NOTE — Telephone Encounter (Signed)
Patient called regarding the Gabapentin prescription that was prescribed. She said that she has been taking it for a month and has noticed dizziness and tiredness during that time. She would like to know if something else could be sent in for her.  Please advise.

## 2019-07-18 NOTE — Telephone Encounter (Signed)
Spoke with patient. She will discontinue medication but would like to come in to speak about other options to manage pain. On schedule in one week.

## 2019-07-25 ENCOUNTER — Ambulatory Visit: Payer: Medicare Other | Admitting: Family Medicine

## 2019-07-25 ENCOUNTER — Encounter: Payer: Self-pay | Admitting: Family Medicine

## 2019-07-25 ENCOUNTER — Other Ambulatory Visit: Payer: Self-pay

## 2019-07-25 DIAGNOSIS — M5416 Radiculopathy, lumbar region: Secondary | ICD-10-CM

## 2019-07-25 MED ORDER — DULOXETINE HCL 20 MG PO CPEP: 20.0000 mg | ORAL_CAPSULE | Freq: Every day | ORAL | 0 refills | Status: DC

## 2019-07-25 NOTE — Assessment & Plan Note (Signed)
Severe arthritic changes and patient has had surgical intervention done previously.  Due to patient's age and comorbidities patient would like to avoid any type of true surgical intervention.  Did respond well to the gabapentin but secondary to side effects had to discontinue.  Started on Cymbalta 20 mg.  I do think will be beneficial.  This is a chronic problem and will continue to likely have intermittent exacerbations.  Patient will follow up with me again in 4 to 6 weeks to see how patient is responding.

## 2019-07-25 NOTE — Patient Instructions (Signed)
Cymbalta 20mg  Keep trying to do home exercises See me in 4 weeks, will increase Cymbalta if working or physical therapy

## 2019-07-25 NOTE — Progress Notes (Signed)
[facility] Phone: [phone] Subjective:   Barbara [name], am serving as a scribe for Dr. Hulan Saas. This visit occurred during the SARS-CoV-2 public health emergency.  Safety protocols were in place, including screening questions prior to the visit, additional usage of staff PPE, and extensive cleaning of exam room while observing appropriate contact time as indicated for disinfecting solutions.   I'm seeing this patient by the request  of:  Binnie Rail, MD  CC: Low back pain follow-up  [id number]   06/19/2019 Chronic problem with exacerbation.  Repeat injection today.  Topical anti-inflammatories encourage, and increase activity, sent to formal physical therapy that I think will be beneficial and started on a low dose of gabapentin for the differential lumbar radiculopathy.  Patient will try these changes and see me again in 6 to 8 weeks  Update 07/25/2019 Barbara [name] is a 86 y.o. female coming in with complaint of left hip pain. Patient states that she has discontinued gabapentin due to being off balance. Pain in side has not come back but pain directly over her spine. Does feel Tylenol helps to ease her pain.   Injection in hip last visit. Feels that this injection did not work as well as the first one. Did have some relief. She has been able to walk more without pain.   IMPRESSION: 1. Progressive moderate to severe degenerative disc disease throughout the lumbar spine. [name] acute osseous abnormality.   Patient has had a history of surgical procedures on her back previously.  Patient states that her legs are starting to feel more weak at the moment.    Past Medical History:  Diagnosis Date  . Aneurysm, ophthalmic artery   . Colonic polyp   . Diverticulosis   . Hypertension   . Urine incontinence    Past Surgical History:  Procedure Laterality Date  . disk removal     Social History    Socioeconomic History  . Marital status: Married    Spouse name: Not on file  . Number of children: Not on file  . Years of education: Not on file  . Highest education level: Not on file  Occupational History  . Not on file  Tobacco Use  . Smoking status: Former Research scientist (life sciences)  . Smokeless tobacco: Former Network engineer and Sexual Activity  . Alcohol use: [name]  . Drug use: [name]  . Sexual activity: Not on file  Other Topics Concern  . Not on file  Social History Narrative  . Not on file   Social Determinants of Health   Financial Resource Strain:   . Difficulty of Paying Living Expenses:   Food Insecurity:   . Worried About Charity fundraiser in the Last Year:   . Arboriculturist in the Last Year:   Transportation Needs:   . Film/video editor (Medical):   Marland Kitchen Lack of Transportation (Non-Medical):   Physical Activity:   . Days of Exercise per Week:   . Minutes of Exercise per Session:   Stress:   . Feeling of Stress :   Social Connections:   . Frequency of Communication with Friends and Family:   . Frequency of Social Gatherings with Friends and Family:   . Attends Religious Services:   . Active Member of Clubs or Organizations:   . Attends Archivist Meetings:   Marland Kitchen Marital Status:    Allergies  Allergen Reactions  .  Amlodipine Swelling    Swelling at 5 mg  . Fosamax [Alendronate]     gerd  . Metoprolol Other (See Comments)    Felt off balance  . Telmisartan Other (See Comments)    jitteriness   [name] family history on file.   Current Outpatient Medications (Cardiovascular):  .  benazepril (LOTENSIN) 5 MG tablet, TAKE 1 TABLET EACH DAY.     Current Outpatient Medications (Other):  Marland Kitchen  Calcium-Vitamin D-Vitamin K 500-500-40 MG-UNT-MCG CHEW, Chew by mouth. .  diclofenac sodium (VOLTAREN) 1 % GEL,  .  fish oil-omega-3 fatty acids 1000 MG capsule, Take 2 g by mouth daily. Marland Kitchen  gabapentin (NEURONTIN) 100 MG capsule, Take 1 capsule (100 mg total) by mouth at  bedtime. .  Methylsulfonylmethane (MSM PO), Take by mouth. .  NON FORMULARY, Slimfast powder .  Nutritional Supplements (ANTI-INFLAMMATORY ENZYME) CAPS, APPLY 1 2 GMS TO AFFECTED AREA 3 4 TIMES DAILY AS NEEDED FOR PAIN  THIS IS A COMPOUNDED CREAM 1 PUMP EQUALS 1 GRAM .  DULoxetine (CYMBALTA) 20 MG capsule, Take 1 capsule (20 mg total) by mouth daily.   Reviewed prior external information including notes and imaging from  primary care provider As well as notes that were available from care everywhere and other healthcare systems.  Past medical history, social, surgical and family history all reviewed in electronic medical record.  [name] pertanent information unless stated regarding to the chief complaint.   Review of Systems:  [name] headache, visual changes, nausea, vomiting, diarrhea, constipation, dizziness, abdominal pain, skin rash, fevers, chills, night sweats, weight loss, swollen lymph nodes, body aches, joint swelling, chest pain, shortness of breath, mood changes. POSITIVE muscle aches  Objective  Blood pressure 118/70, pulse 83, height 5\' 1"  (1.549 m), weight 149 lb (67.6 kg), SpO2 99 %.   General: [name] apparent distress alert and oriented x3 mood and affect normal, dressed appropriately.  HEENT: Pupils equal, extraocular movements intact  Respiratory: Patient's speak in full sentences and does not appear short of breath  Cardiovascular: [name] lower extremity edema, non tender, [name] erythema  Neuro: Cranial nerves II through XII are intact, neurovascularly intact in all extremities with 2+ DTRs and 2+ pulses.  Gait severely antalgic Low back shows severe arthritic changes with scoliosis noted lumbar spine.  Positive radicular symptoms with straight leg test on the left side.  Patient has 4 out of 5 strength of the lower extremities may be some mild increase in weakness on the left compared to the right.    Impression and Recommendations:     This case required medical decision making of  moderate complexity. The above documentation has been reviewed and is accurate and complete , DO       Note: This dictation was prepared with Dragon dictation along with smaller phrase technology. Any transcriptional errors that result from this process are unintentional.

## 2019-08-06 ENCOUNTER — Encounter: Payer: Self-pay | Admitting: Internal Medicine

## 2019-08-06 DIAGNOSIS — R6884 Jaw pain: Secondary | ICD-10-CM | POA: Diagnosis not present

## 2019-08-06 DIAGNOSIS — M316 Other giant cell arteritis: Secondary | ICD-10-CM | POA: Diagnosis not present

## 2019-08-06 DIAGNOSIS — R519 Headache, unspecified: Secondary | ICD-10-CM | POA: Diagnosis not present

## 2019-08-06 DIAGNOSIS — M150 Primary generalized (osteo)arthritis: Secondary | ICD-10-CM | POA: Diagnosis not present

## 2019-08-15 ENCOUNTER — Ambulatory Visit: Payer: Medicare Other | Admitting: Family Medicine

## 2019-08-23 ENCOUNTER — Ambulatory Visit: Payer: Medicare Other | Admitting: Family Medicine

## 2019-08-23 ENCOUNTER — Encounter: Payer: Self-pay | Admitting: Family Medicine

## 2019-08-23 ENCOUNTER — Other Ambulatory Visit: Payer: Self-pay

## 2019-08-23 DIAGNOSIS — M5416 Radiculopathy, lumbar region: Secondary | ICD-10-CM

## 2019-08-23 NOTE — Patient Instructions (Signed)
Good to see you.  Keep up being active Continue the gabapentin  Let me know if you change your mind on the MRI and we can get it ordered See me again in 3 month

## 2019-08-23 NOTE — Progress Notes (Signed)
[facility] [address] [place] [address] Phone: [phone] Subjective:   Barbara [name], am serving as a scribe for Dr. Antoine Primas.  I'm seeing this patient by the request  of:  Pincus Sanes, MD  CC: Neck and back pain follow-up  [id number]   07/25/2019 Severe arthritic changes and patient has had surgical intervention done previously.  Due to patient's age and comorbidities patient would like to avoid any type of true surgical intervention.  Did respond well to the gabapentin but secondary to side effects had to discontinue.  Started on Cymbalta 20 mg.  I do think will be beneficial.  This is a chronic problem and will continue to likely have intermittent exacerbations.  Patient will follow up with me again in 4 to 6 weeks to see how patient is responding.  Update 08/23/2019 Barbara [name] is a 86 y.o. female coming in with complaint of Hip pain. Sis have an x-ray done of cervical and thoracic areas and brought the results to go over them, done at rheumatologist.  Patient states that she has never without some type of pain.  Patient did not take the Cymbalta because she did not feel like it was helping and has been very noncompliant with the gabapentin as well.  Not doing the exercises regularly either.      Past Medical History:  Diagnosis Date  . Aneurysm, ophthalmic artery   . Colonic polyp   . Diverticulosis   . Hypertension   . Urine incontinence    Past Surgical History:  Procedure Laterality Date  . disk removal     Social History   Socioeconomic History  . Marital status: Married    Spouse name: Not on file  . Number of children: Not on file  . Years of education: Not on file  . Highest education level: Not on file  Occupational History  . Not on file  Tobacco Use  . Smoking status: Former Games developer  . Smokeless tobacco: Former Engineer, water and Sexual Activity  . Alcohol use: No  . Drug use: No  . Sexual  activity: Not on file  Other Topics Concern  . Not on file  Social History Narrative  . Not on file   Social Determinants of Health   Financial Resource Strain:   . Difficulty of Paying Living Expenses:   Food Insecurity:   . Worried About Programme researcher, broadcasting/film/video in the Last Year:   . Barista in the Last Year:   Transportation Needs:   . Freight forwarder (Medical):   Marland Kitchen Lack of Transportation (Non-Medical):   Physical Activity:   . Days of Exercise per Week:   . Minutes of Exercise per Session:   Stress:   . Feeling of Stress :   Social Connections:   . Frequency of Communication with Friends and Family:   . Frequency of Social Gatherings with Friends and Family:   . Attends Religious Services:   . Active Member of Clubs or Organizations:   . Attends Banker Meetings:   Marland Kitchen Marital Status:    Allergies  Allergen Reactions  . Amlodipine Swelling    Swelling at 5 mg  . Fosamax [Alendronate]     gerd  . Metoprolol Other (See Comments)    Felt off balance  . Telmisartan Other (See Comments)    jitteriness   History reviewed. No pertinent family history.   Current Outpatient Medications (Cardiovascular):  .  benazepril (LOTENSIN) 5 MG tablet, TAKE 1 TABLET EACH DAY.     Current Outpatient Medications (Other):  Marland Kitchen  Calcium-Vitamin D-Vitamin K 474-259-56 MG-UNT-MCG CHEW, Chew by mouth. .  diclofenac sodium (VOLTAREN) 1 % GEL,  .  fish oil-omega-3 fatty acids 1000 MG capsule, Take 2 g by mouth daily. Marland Kitchen  gabapentin (NEURONTIN) 100 MG capsule, Take 1 capsule (100 mg total) by mouth at bedtime. .  Methylsulfonylmethane (MSM PO), Take by mouth. .  NON FORMULARY, Slimfast powder .  Nutritional Supplements (ANTI-INFLAMMATORY ENZYME) CAPS, APPLY 1 2 GMS TO AFFECTED AREA 3 4 TIMES DAILY AS NEEDED FOR PAIN  THIS IS A COMPOUNDED CREAM 1 PUMP EQUALS 1 GRAM .  DULoxetine (CYMBALTA) 20 MG capsule, Take 1 capsule (20 mg total) by mouth daily. (Patient not taking:  Reported on 08/23/2019)   Reviewed prior external information including notes and imaging from  primary care provider As well as notes that were available from care everywhere and other healthcare systems.  Past medical history, social, surgical and family history all reviewed in electronic medical record.  No pertanent information unless stated regarding to the chief complaint.   Review of Systems:  No headache, visual changes, nausea, vomiting, diarrhea, constipation, dizziness, abdominal pain, skin rash, fevers, chills, night sweats, weight loss, swollen lymph nodes, , joint swelling, chest pain, shortness of breath, mood changes. POSITIVE muscle aches  Objective  Blood pressure 118/62, pulse 79, height 5\' 1"  (1.549 m), weight 147 lb (66.7 kg), SpO2 99 %.   General: No apparent distress alert and oriented x3 mood and affect normal, dressed appropriately.  HEENT: Pupils equal, extraocular movements intact  Respiratory: Patient's speak in full sentences and does not appear short of breath  Severe arthritic changes of the back noted with an increasing kyphosis of the thoracic spine and lumbar spine does have significant dextroscoliosis noted.  Patient has pain more on the left side of thoracic pain but the left-sided lumbar is worse as well.  Mild positive straight leg test on the right with radicular symptoms of 25 degrees in the L5 and S1 distributions.  4 out of 5 strength on the right leg compared to 4+ strength of the left leg especially with hip flexion and dorsiflexion of the foot    Impression and Recommendations:     The above documentation has been reviewed and is accurate and complete Lyndal Pulley, DO       Note: This dictation was prepared with Dragon dictation along with smaller phrase technology. Any transcriptional errors that result from this process are unintentional.

## 2019-08-23 NOTE — Assessment & Plan Note (Signed)
Continued degenerative disc disease.  Patient does have a fairly severe overall.  Patient does have radicular symptoms.  Patient though is still being able to stay active.  Unable to tolerate the Cymbalta.  Feels the gabapentin also gives her difficulty and we told her to use it as needed.  Discussed multiple different options including MRI and injections which patient declined at the moment.  Patient would not want to have any surgical intervention.  Patient has declined physical therapy.  Patient then will come back in 3 months for further evaluation.  Total time with patient today 37 minutes.

## 2019-08-28 ENCOUNTER — Other Ambulatory Visit: Payer: Self-pay | Admitting: Internal Medicine

## 2019-09-06 DIAGNOSIS — Z6827 Body mass index (BMI) 27.0-27.9, adult: Secondary | ICD-10-CM | POA: Diagnosis not present

## 2019-09-06 DIAGNOSIS — M150 Primary generalized (osteo)arthritis: Secondary | ICD-10-CM | POA: Diagnosis not present

## 2019-09-06 DIAGNOSIS — M316 Other giant cell arteritis: Secondary | ICD-10-CM | POA: Diagnosis not present

## 2019-09-06 DIAGNOSIS — M542 Cervicalgia: Secondary | ICD-10-CM | POA: Diagnosis not present

## 2019-10-09 DIAGNOSIS — H903 Sensorineural hearing loss, bilateral: Secondary | ICD-10-CM | POA: Diagnosis not present

## 2019-11-13 DIAGNOSIS — Z961 Presence of intraocular lens: Secondary | ICD-10-CM | POA: Diagnosis not present

## 2019-11-13 DIAGNOSIS — H353131 Nonexudative age-related macular degeneration, bilateral, early dry stage: Secondary | ICD-10-CM | POA: Diagnosis not present

## 2019-11-15 DIAGNOSIS — H903 Sensorineural hearing loss, bilateral: Secondary | ICD-10-CM | POA: Diagnosis not present

## 2019-11-20 ENCOUNTER — Ambulatory Visit: Payer: Medicare Other | Admitting: Family Medicine

## 2019-11-23 ENCOUNTER — Other Ambulatory Visit: Payer: Self-pay | Admitting: Internal Medicine

## 2019-12-11 ENCOUNTER — Ambulatory Visit (INDEPENDENT_AMBULATORY_CARE_PROVIDER_SITE_OTHER): Payer: Medicare Other | Admitting: Internal Medicine

## 2019-12-11 ENCOUNTER — Other Ambulatory Visit: Payer: Self-pay

## 2019-12-11 ENCOUNTER — Encounter: Payer: Self-pay | Admitting: Internal Medicine

## 2019-12-11 VITALS — BP 102/64 | HR 76 | Temp 98.5°F | Wt 147.2 lb

## 2019-12-11 DIAGNOSIS — N1831 Chronic kidney disease, stage 3a: Secondary | ICD-10-CM | POA: Diagnosis not present

## 2019-12-11 DIAGNOSIS — I10 Essential (primary) hypertension: Secondary | ICD-10-CM

## 2019-12-11 DIAGNOSIS — N183 Chronic kidney disease, stage 3 unspecified: Secondary | ICD-10-CM | POA: Insufficient documentation

## 2019-12-11 DIAGNOSIS — R7303 Prediabetes: Secondary | ICD-10-CM

## 2019-12-11 DIAGNOSIS — M810 Age-related osteoporosis without current pathological fracture: Secondary | ICD-10-CM

## 2019-12-11 DIAGNOSIS — Z0000 Encounter for general adult medical examination without abnormal findings: Secondary | ICD-10-CM

## 2019-12-11 DIAGNOSIS — N1832 Chronic kidney disease, stage 3b: Secondary | ICD-10-CM | POA: Insufficient documentation

## 2019-12-11 DIAGNOSIS — I700 Atherosclerosis of aorta: Secondary | ICD-10-CM

## 2019-12-11 DIAGNOSIS — H903 Sensorineural hearing loss, bilateral: Secondary | ICD-10-CM

## 2019-12-11 MED ORDER — BENAZEPRIL HCL 5 MG PO TABS: 2.5000 mg | ORAL_TABLET | Freq: Every day | ORAL | 1 refills | Status: DC

## 2019-12-11 NOTE — Assessment & Plan Note (Signed)
Chronic On low side here and at home sometimes Decrease benazepril to 2.5 mg daily She will monitor BP Cmp, tsh

## 2019-12-11 NOTE — Patient Instructions (Addendum)
Blood work was ordered.    All other Health Maintenance issues reviewed.   All recommended immunizations and age-appropriate screenings are up-to-date or discussed.  No immunization administered today.   Medications reviewed and updated.  Changes include :   none      Please followup in 1 year, sooner if needed   Health Maintenance, Female Adopting a healthy lifestyle and getting preventive care are important in promoting health and wellness. Ask your health care provider about:  The right schedule for you to have regular tests and exams.  Things you can do on your own to prevent diseases and keep yourself healthy. What should I know about diet, weight, and exercise? Eat a healthy diet   Eat a diet that includes plenty of vegetables, fruits, low-fat dairy products, and lean protein.  Do not eat a lot of foods that are high in solid fats, added sugars, or sodium. Maintain a healthy weight Body mass index (BMI) is used to identify weight problems. It estimates body fat based on height and weight. Your health care provider can help determine your BMI and help you achieve or maintain a healthy weight. Get regular exercise Get regular exercise. This is one of the most important things you can do for your health. Most adults should:  Exercise for at least 150 minutes each week. The exercise should increase your heart rate and make you sweat (moderate-intensity exercise).  Do strengthening exercises at least twice a week. This is in addition to the moderate-intensity exercise.  Spend less time sitting. Even light physical activity can be beneficial. Watch cholesterol and blood lipids Have your blood tested for lipids and cholesterol at 84 years of age, then have this test every 5 years. Have your cholesterol levels checked more often if:  Your lipid or cholesterol levels are high.  You are older than 84 years of age.  You are at high risk for heart disease. What should I  know about cancer screening? Depending on your health history and family history, you may need to have cancer screening at various ages. This may include screening for:  Breast cancer.  Cervical cancer.  Colorectal cancer.  Skin cancer.  Lung cancer. What should I know about heart disease, diabetes, and high blood pressure? Blood pressure and heart disease  High blood pressure causes heart disease and increases the risk of stroke. This is more likely to develop in people who have high blood pressure readings, are of African descent, or are overweight.  Have your blood pressure checked: ? Every 3-5 years if you are 18-39 years of age. ? Every year if you are 40 years old or older. Diabetes Have regular diabetes screenings. This checks your fasting blood sugar level. Have the screening done:  Once every three years after age 40 if you are at a normal weight and have a low risk for diabetes.  More often and at a younger age if you are overweight or have a high risk for diabetes. What should I know about preventing infection? Hepatitis B If you have a higher risk for hepatitis B, you should be screened for this virus. Talk with your health care provider to find out if you are at risk for hepatitis B infection. Hepatitis C Testing is recommended for:  Everyone born from 1945 through 1965.  Anyone with known risk factors for hepatitis C. Sexually transmitted infections (STIs)  Get screened for STIs, including gonorrhea and chlamydia, if: ? You are sexually active and are younger   than 84 years of age. ? You are older than 84 years of age and your health care provider tells you that you are at risk for this type of infection. ? Your sexual activity has changed since you were last screened, and you are at increased risk for chlamydia or gonorrhea. Ask your health care provider if you are at risk.  Ask your health care provider about whether you are at high risk for HIV. Your health  care provider may recommend a prescription medicine to help prevent HIV infection. If you choose to take medicine to prevent HIV, you should first get tested for HIV. You should then be tested every 3 months for as long as you are taking the medicine. Pregnancy  If you are about to stop having your period (premenopausal) and you may become pregnant, seek counseling before you get pregnant.  Take 400 to 800 micrograms (mcg) of folic acid every day if you become pregnant.  Ask for birth control (contraception) if you want to prevent pregnancy. Osteoporosis and menopause Osteoporosis is a disease in which the bones lose minerals and strength with aging. This can result in bone fractures. If you are 65 years old or older, or if you are at risk for osteoporosis and fractures, ask your health care provider if you should:  Be screened for bone loss.  Take a calcium or vitamin D supplement to lower your risk of fractures.  Be given hormone replacement therapy (HRT) to treat symptoms of menopause. Follow these instructions at home: Lifestyle  Do not use any products that contain nicotine or tobacco, such as cigarettes, e-cigarettes, and chewing tobacco. If you need help quitting, ask your health care provider.  Do not use street drugs.  Do not share needles.  Ask your health care provider for help if you need support or information about quitting drugs. Alcohol use  Do not drink alcohol if: ? Your health care provider tells you not to drink. ? You are pregnant, may be pregnant, or are planning to become pregnant.  If you drink alcohol: ? Limit how much you use to 0-1 drink a day. ? Limit intake if you are breastfeeding.  Be aware of how much alcohol is in your drink. In the [place] one drink equals one 12 oz bottle of beer (355 mL), one 5 oz glass of wine (148 mL), or one 1 oz glass of hard liquor (44 mL). General instructions  Schedule regular health, dental, and eye exams.  Stay  current with your vaccines.  Tell your health care provider if: ? You often feel depressed. ? You have ever been abused or do not feel safe at home. Summary  Adopting a healthy lifestyle and getting preventive care are important in promoting health and wellness.  Follow your health care provider's instructions about healthy diet, exercising, and getting tested or screened for diseases.  Follow your health care provider's instructions on monitoring your cholesterol and blood pressure. This information is not intended to replace advice given to you by your health care provider. Make sure you discuss any questions you have with your health care provider. Document Revised: 02/21/2018 Document Reviewed: 02/21/2018 Elsevier Patient Education  2020 [employer]  

## 2019-12-11 NOTE — Assessment & Plan Note (Addendum)
Chronic Not on statin at this time - would prefer to avoid Check lipid panel Exercising some - will increase Eats a healthy diet

## 2019-12-11 NOTE — Assessment & Plan Note (Signed)
Chronic Check a1c Low sugar / carb diet Stressed regular exercise  

## 2019-12-11 NOTE — Assessment & Plan Note (Signed)
Now wearing hearing aids 

## 2019-12-11 NOTE — Assessment & Plan Note (Signed)
Chronic dexa due - ordered She is not sure if she would want to go on medication or not Stressed regular exercise Taking calcium and vitamin d

## 2019-12-11 NOTE — Assessment & Plan Note (Signed)
Chronic She does not drink enough fluids-encouraged to increase her water intake She is not taking any NSAIDs Blood pressure probably overcontrolled-we will try half of the benazepril daily and she will continue to monitor her BP at home CMP

## 2019-12-11 NOTE — Progress Notes (Signed)
Subjective:    Patient ID: Barbara [name], female    DOB: 07-22-1932, 86 y.o.   MRN: [id number]  HPI She is here for a physical exam.   She still has back pain and left hip pain.  She follows with Dr Katrinka Blazing. She takes tylenol for the pain.       She does not drink enough water.   Medications and allergies reviewed with patient and updated if appropriate.  Patient Active Problem List   Diagnosis Date Noted  . CKD (chronic kidney disease) stage 3, GFR 30-59 ml/min (HCC) 12/11/2019  . BPPV (benign paroxysmal positional vertigo), right 07/24/2018  . Left lumbar radiculopathy 05/09/2018  . Erythema of skin 05/09/2018  . Sensorineural hearing loss (SNHL), bilateral 01/31/2018  . Trochanteric bursitis of left hip 01/18/2018  . Nephrolithiasis 01/17/2018  . Left-sided thoracic back pain 01/01/2018  . Prediabetes 06/25/2016  . Aortic atherosclerosis (HCC) 06/22/2016  . Osteoporosis 06/22/2016  . Bilateral primary osteoarthritis of first carpometacarpal joints 08/17/2015  . Hypertension 03/18/2015  . Temporal arteritis (HCC) 01/05/2015  . Aneurysm, ophthalmic artery 12/11/2014    Current Outpatient Medications on File Prior to Visit  Medication Sig Dispense Refill  . benazepril (LOTENSIN) 5 MG tablet TAKE 1 TABLET EACH DAY. 90 tablet 1  . Calcium-Vitamin D-Vitamin K 500-500-40 MG-UNT-MCG CHEW Chew by mouth. TAKE 2 A DAY    . diclofenac sodium (VOLTAREN) 1 % GEL     . fish oil-omega-3 fatty acids 1000 MG capsule Take 2 g by mouth daily.    . Methylsulfonylmethane (MSM PO) Take by mouth.    . NON FORMULARY Slimfast powder    . Nutritional Supplements (ANTI-INFLAMMATORY ENZYME) CAPS APPLY 1 2 GMS TO AFFECTED AREA 3 4 TIMES DAILY AS NEEDED FOR PAIN  THIS IS A COMPOUNDED CREAM 1 PUMP EQUALS 1 GRAM  1  . Vitamin D3 (VITAMIN D) 25 MCG tablet Take 1,000 Units by mouth daily.     No current facility-administered medications on file prior to visit.    Past Medical History:  Diagnosis  Date  . Aneurysm, ophthalmic artery   . Colonic polyp   . Diverticulosis   . Hypertension   . Urine incontinence     Past Surgical History:  Procedure Laterality Date  . disk removal      Social History   Socioeconomic History  . Marital status: Married    Spouse name: Not on file  . Number of children: Not on file  . Years of education: Not on file  . Highest education level: Not on file  Occupational History  . Not on file  Tobacco Use  . Smoking status: Former Games developer  . Smokeless tobacco: Former Engineer, water and Sexual Activity  . Alcohol use: No  . Drug use: No  . Sexual activity: Not on file  Other Topics Concern  . Not on file  Social History Narrative  . Not on file   Social Determinants of Health   Financial Resource Strain:   . Difficulty of Paying Living Expenses: Not on file  Food Insecurity:   . Worried About Programme researcher, broadcasting/film/video in the Last Year: Not on file  . Ran Out of Food in the Last Year: Not on file  Transportation Needs:   . Lack of Transportation (Medical): Not on file  . Lack of Transportation (Non-Medical): Not on file  Physical Activity:   . Days of Exercise per Week: Not on file  . Minutes of Exercise  per Session: Not on file  Stress:   . Feeling of Stress : Not on file  Social Connections:   . Frequency of Communication with Friends and Family: Not on file  . Frequency of Social Gatherings with Friends and Family: Not on file  . Attends Religious Services: Not on file  . Active Member of Clubs or Organizations: Not on file  . Attends Banker Meetings: Not on file  . Marital Status: Not on file    History reviewed. No pertinent family history.  Review of Systems  Constitutional: Negative for chills and fever.  Eyes: Negative for visual disturbance.  Respiratory: Negative for cough, shortness of breath and wheezing.   Cardiovascular: Positive for leg swelling (rare- if extra salt intake). Negative for chest pain  and palpitations.  Gastrointestinal: Negative for abdominal pain, blood in stool, constipation, diarrhea and nausea.       No gerd  Genitourinary: Negative for dysuria and hematuria.       Incontinence  Musculoskeletal: Positive for arthralgias (wrists, hands, hip) and back pain.  Skin: Negative for color change and rash.       Rosacea on cheeks  Neurological: Negative for light-headedness and headaches.  Psychiatric/Behavioral: Negative for dysphoric mood. The patient is nervous/anxious (occ).        Objective:   Vitals:   12/11/19 1004  BP: 102/64  Pulse: 76  Temp: 98.5 F (36.9 C)  SpO2: 97%   Filed Weights   12/11/19 1004  Weight: 147 lb 3.2 oz (66.8 kg)   Body mass index is 27.81 kg/m.  BP Readings from Last 3 Encounters:  12/11/19 102/64  08/23/19 118/62  07/25/19 118/70    Wt Readings from Last 3 Encounters:  12/11/19 147 lb 3.2 oz (66.8 kg)  08/23/19 147 lb (66.7 kg)  07/25/19 149 lb (67.6 kg)     Physical Exam Constitutional: She appears well-developed and well-nourished. No distress.  HENT:  Head: Normocephalic and atraumatic.  Right Ear: External ear normal. Normal ear canal and TM Left Ear: External ear normal.  Normal ear canal and TM Mouth/Throat: Oropharynx is clear and moist.  Eyes: Conjunctivae and EOM are normal.  Neck: Neck supple. No tracheal deviation present. No thyromegaly present.  No carotid bruit  Cardiovascular: Normal rate, regular rhythm and normal heart sounds.   No murmur heard.  No edema. Pulmonary/Chest: Effort normal and breath sounds normal. No respiratory distress. She has no wheezes. She has no rales.  Breast: deferred   Abdominal: Soft. She exhibits no distension. There is no tenderness.  Lymphadenopathy: She has no cervical adenopathy.  Skin: Skin is warm and dry. She is not diaphoretic.  Psychiatric: She has a normal mood and affect. Her behavior is normal.        Assessment & Plan:   Physical exam: Screening  blood work    ordered Immunizations  Discussed covid booster, deferred flu, discussed shingrix Colonoscopy  N/a due to age Mammogram  N/a due to age 65  n/a Dexa  Due - ordered for [facility] exams   Up to date  Exercise  Walks some, yard work [employer]   - good Substance abuse  none  See Problem List for Assessment and Plan of chronic medical problems.   This visit occurred during the SARS-CoV-2 public health emergency.  Safety protocols were in place, including screening questions prior to the visit, additional usage of staff PPE, and extensive cleaning of exam room while observing appropriate contact time as  indicated for disinfecting solutions.

## 2019-12-12 ENCOUNTER — Encounter: Payer: Self-pay | Admitting: Internal Medicine

## 2019-12-12 LAB — CBC WITH DIFFERENTIAL/PLATELET
Absolute Monocytes: 570 cells/uL (ref 200–950)
Basophils Absolute: 42 cells/uL (ref 0–200)
Basophils Relative: 0.7 %
Eosinophils Absolute: 252 cells/uL (ref 15–500)
Eosinophils Relative: 4.2 %
HCT: 41.7 % (ref 35.0–45.0)
Hemoglobin: 14 g/dL (ref 11.7–15.5)
Lymphs Abs: 1410 cells/uL (ref 850–3900)
MCH: 31.6 pg (ref 27.0–33.0)
MCHC: 33.6 g/dL (ref 32.0–36.0)
MCV: 94.1 fL (ref 80.0–100.0)
MPV: 12.7 fL — ABNORMAL HIGH (ref 7.5–12.5)
Monocytes Relative: 9.5 %
Neutro Abs: 3726 cells/uL (ref 1500–7800)
Neutrophils Relative %: 62.1 %
Platelets: 182 10*3/uL (ref 140–400)
RBC: 4.43 10*6/uL (ref 3.80–5.10)
RDW: 12.1 % (ref 11.0–15.0)
Total Lymphocyte: 23.5 %
WBC: 6 10*3/uL (ref 3.8–10.8)

## 2019-12-12 LAB — COMPREHENSIVE METABOLIC PANEL
AG Ratio: 1.7 (calc) (ref 1.0–2.5)
ALT: 12 U/L (ref 6–29)
AST: 17 U/L (ref 10–35)
Albumin: 4.2 g/dL (ref 3.6–5.1)
Alkaline phosphatase (APISO): 82 U/L (ref 37–153)
BUN/Creatinine Ratio: 26 (calc) — ABNORMAL HIGH (ref 6–22)
BUN: 32 mg/dL — ABNORMAL HIGH (ref 7–25)
CO2: 33 mmol/L — ABNORMAL HIGH (ref 20–32)
Calcium: 10.5 mg/dL — ABNORMAL HIGH (ref 8.6–10.4)
Chloride: 106 mmol/L (ref 98–110)
Creat: 1.21 mg/dL — ABNORMAL HIGH (ref 0.60–0.88)
Globulin: 2.5 g/dL (calc) (ref 1.9–3.7)
Glucose, Bld: 96 mg/dL (ref 65–99)
Potassium: 5.8 mmol/L — ABNORMAL HIGH (ref 3.5–5.3)
Sodium: 143 mmol/L (ref 135–146)
Total Bilirubin: 0.9 mg/dL (ref 0.2–1.2)
Total Protein: 6.7 g/dL (ref 6.1–8.1)

## 2019-12-12 LAB — VITAMIN D 25 HYDROXY (VIT D DEFICIENCY, FRACTURES): Vit D, 25-Hydroxy: 43 ng/mL (ref 30–100)

## 2019-12-12 LAB — HEMOGLOBIN A1C
Hgb A1c MFr Bld: 5.6 % of total Hgb (ref <5.7)
Mean Plasma Glucose: 114 (calc)
eAG (mmol/L): 6.3 (calc)

## 2019-12-12 LAB — LIPID PANEL
Cholesterol: 217 mg/dL — ABNORMAL HIGH (ref <200)
HDL: 77 mg/dL (ref >=50)
LDL Cholesterol (Calc): 109 mg/dL (calc) — ABNORMAL HIGH
Non-HDL Cholesterol (Calc): 140 mg/dL (calc) — ABNORMAL HIGH (ref <130)
Total CHOL/HDL Ratio: 2.8 (calc) (ref <5.0)
Triglycerides: 191 mg/dL — ABNORMAL HIGH (ref <150)

## 2019-12-12 LAB — TSH: TSH: 1.3 mIU/L (ref 0.40–4.50)

## 2019-12-12 NOTE — Addendum Note (Signed)
Addended by: Pincus Sanes on: 12/12/2019 07:37 AM   Modules accepted: Orders

## 2019-12-16 ENCOUNTER — Encounter: Payer: Self-pay | Admitting: Internal Medicine

## 2019-12-16 ENCOUNTER — Other Ambulatory Visit (INDEPENDENT_AMBULATORY_CARE_PROVIDER_SITE_OTHER): Payer: Medicare Other

## 2019-12-16 DIAGNOSIS — N1831 Chronic kidney disease, stage 3a: Secondary | ICD-10-CM

## 2019-12-16 LAB — BASIC METABOLIC PANEL
BUN: 25 mg/dL — ABNORMAL HIGH (ref 6–23)
CO2: 29 mEq/L (ref 19–32)
Calcium: 9.9 mg/dL (ref 8.4–10.5)
Chloride: 103 mEq/L (ref 96–112)
Creatinine, Ser: 1.09 mg/dL (ref 0.40–1.20)
GFR: 47.71 mL/min — ABNORMAL LOW (ref >60.00)
Glucose, Bld: 132 mg/dL — ABNORMAL HIGH (ref 70–99)
Potassium: 4.1 mEq/L (ref 3.5–5.1)
Sodium: 140 mEq/L (ref 135–145)

## 2019-12-16 NOTE — Addendum Note (Signed)
Addended by: Miguel Aschoff on: 12/16/2019 10:52 AM   Modules accepted: Orders

## 2019-12-18 ENCOUNTER — Encounter: Payer: Self-pay | Admitting: Internal Medicine

## 2019-12-19 ENCOUNTER — Encounter: Payer: Self-pay | Admitting: Internal Medicine

## 2019-12-19 DIAGNOSIS — N1831 Chronic kidney disease, stage 3a: Secondary | ICD-10-CM

## 2019-12-23 DIAGNOSIS — H6123 Impacted cerumen, bilateral: Secondary | ICD-10-CM | POA: Diagnosis not present

## 2019-12-24 ENCOUNTER — Ambulatory Visit
Admission: RE | Admit: 2019-12-24 | Discharge: 2019-12-24 | Disposition: A | Discharge status: Home / Self Care | Payer: Medicare Other | Source: Ambulatory Visit | Attending: Internal Medicine | Admitting: Internal Medicine

## 2019-12-24 ENCOUNTER — Other Ambulatory Visit: Payer: Self-pay

## 2019-12-24 DIAGNOSIS — M85852 Other specified disorders of bone density and structure, left thigh: Secondary | ICD-10-CM | POA: Diagnosis not present

## 2019-12-24 DIAGNOSIS — Z780 Asymptomatic menopausal state: Secondary | ICD-10-CM | POA: Diagnosis not present

## 2019-12-24 DIAGNOSIS — M810 Age-related osteoporosis without current pathological fracture: Secondary | ICD-10-CM | POA: Diagnosis not present

## 2019-12-27 ENCOUNTER — Encounter: Payer: Self-pay | Admitting: Internal Medicine

## 2019-12-30 ENCOUNTER — Ambulatory Visit
Admission: RE | Admit: 2019-12-30 | Discharge: 2019-12-30 | Disposition: A | Discharge status: Home / Self Care | Payer: Medicare Other | Source: Ambulatory Visit | Attending: Internal Medicine | Admitting: Internal Medicine

## 2019-12-30 ENCOUNTER — Other Ambulatory Visit: Payer: Self-pay

## 2019-12-30 DIAGNOSIS — N1831 Chronic kidney disease, stage 3a: Secondary | ICD-10-CM

## 2019-12-31 ENCOUNTER — Encounter: Payer: Self-pay | Admitting: Internal Medicine

## 2020-01-02 NOTE — Progress Notes (Signed)
Subjective:    Patient ID: Barbara [name], female    DOB: 1933-01-13, 86 y.o.   MRN: [id number]  HPI The patient is here for follow up of her chronic medical problems.   Osteoporosis: She is here to review her bone density scan and discuss treatment.  CKd: She is also here to discuss her chronic kidney disease.   Medications and allergies reviewed with patient and updated if appropriate.  Patient Active Problem List   Diagnosis Date Noted  . CKD (chronic kidney disease) stage 3, GFR 30-59 ml/min (HCC) 12/11/2019  . BPPV (benign paroxysmal positional vertigo), right 07/24/2018  . Left lumbar radiculopathy 05/09/2018  . Erythema of skin 05/09/2018  . Sensorineural hearing loss (SNHL), bilateral 01/31/2018  . Trochanteric bursitis of left hip 01/18/2018  . Nephrolithiasis 01/17/2018  . Left-sided thoracic back pain 01/01/2018  . Prediabetes 06/25/2016  . Aortic atherosclerosis (HCC) 06/22/2016  . Osteoporosis 06/22/2016  . Bilateral primary osteoarthritis of first carpometacarpal joints 08/17/2015  . Hypertension 03/18/2015  . Temporal arteritis (HCC) 01/05/2015  . Aneurysm, ophthalmic artery 12/11/2014    Current Outpatient Medications on File Prior to Visit  Medication Sig Dispense Refill  . benazepril (LOTENSIN) 5 MG tablet Take 0.5 tablets (2.5 mg total) by mouth daily. 90 tablet 1  . Calcium-Vitamin D-Vitamin K 500-500-40 MG-UNT-MCG CHEW Chew by mouth. TAKE 2 A DAY    . diclofenac sodium (VOLTAREN) 1 % GEL     . fish oil-omega-3 fatty acids 1000 MG capsule Take 2 g by mouth daily.    . Methylsulfonylmethane (MSM PO) Take by mouth.    . NON FORMULARY Slimfast powder    . Nutritional Supplements (ANTI-INFLAMMATORY ENZYME) CAPS APPLY 1 2 GMS TO AFFECTED AREA 3 4 TIMES DAILY AS NEEDED FOR PAIN  THIS IS A COMPOUNDED CREAM 1 PUMP EQUALS 1 GRAM  1  . Vitamin D3 (VITAMIN D) 25 MCG tablet Take 1,000 Units by mouth daily.     No current facility-administered medications on  file prior to visit.    Past Medical History:  Diagnosis Date  . Aneurysm, ophthalmic artery   . Colonic polyp   . Diverticulosis   . Hypertension   . Urine incontinence     Past Surgical History:  Procedure Laterality Date  . disk removal      Social History   Socioeconomic History  . Marital status: Widowed    Spouse name: Not on file  . Number of children: Not on file  . Years of education: Not on file  . Highest education level: Not on file  Occupational History  . Not on file  Tobacco Use  . Smoking status: Former Games developer  . Smokeless tobacco: Former Engineer, water and Sexual Activity  . Alcohol use: No  . Drug use: No  . Sexual activity: Not on file  Other Topics Concern  . Not on file  Social History Narrative  . Not on file   Social Determinants of Health   Financial Resource Strain:   . Difficulty of Paying Living Expenses: Not on file  Food Insecurity:   . Worried About Programme researcher, broadcasting/film/video in the Last Year: Not on file  . Ran Out of Food in the Last Year: Not on file  Transportation Needs:   . Lack of Transportation (Medical): Not on file  . Lack of Transportation (Non-Medical): Not on file  Physical Activity:   . Days of Exercise per Week: Not on file  . Minutes of  Exercise per Session: Not on file  Stress:   . Feeling of Stress : Not on file  Social Connections:   . Frequency of Communication with Friends and Family: Not on file  . Frequency of Social Gatherings with Friends and Family: Not on file  . Attends Religious Services: Not on file  . Active Member of Clubs or Organizations: Not on file  . Attends Banker Meetings: Not on file  . Marital Status: Not on file    History reviewed. No pertinent family history.  Review of Systems     Objective:   Vitals:   01/03/20 1038  BP: 114/78  Pulse: 75  Temp: 98.3 F (36.8 C)  SpO2: 95%   BP Readings from Last 3 Encounters:  01/03/20 114/78  12/11/19 102/64  08/23/19  118/62   Wt Readings from Last 3 Encounters:  01/03/20 143 lb (64.9 kg)  12/11/19 147 lb 3.2 oz (66.8 kg)  08/23/19 147 lb (66.7 kg)   Body mass index is 27.02 kg/m.   Physical Exam    Constitutional: Appears well-developed and well-nourished. No distress.  Skin: Skin is warm and dry. Not diaphoretic.  Psychiatric: Normal mood and affect. Behavior is normal.      Assessment & Plan:    See Problem List for Assessment and Plan of chronic medical problems.    This visit occurred during the SARS-CoV-2 public health emergency.  Safety protocols were in place, including screening questions prior to the visit, additional usage of staff PPE, and extensive cleaning of exam room while observing appropriate contact time as indicated for disinfecting solutions.

## 2020-01-03 ENCOUNTER — Other Ambulatory Visit: Payer: Self-pay

## 2020-01-03 ENCOUNTER — Ambulatory Visit (INDEPENDENT_AMBULATORY_CARE_PROVIDER_SITE_OTHER): Payer: Medicare Other | Admitting: Internal Medicine

## 2020-01-03 ENCOUNTER — Encounter: Payer: Self-pay | Admitting: Internal Medicine

## 2020-01-03 VITALS — BP 114/78 | HR 75 | Temp 98.3°F | Ht 61.0 in | Wt 143.0 lb

## 2020-01-03 DIAGNOSIS — M810 Age-related osteoporosis without current pathological fracture: Secondary | ICD-10-CM

## 2020-01-03 DIAGNOSIS — N1831 Chronic kidney disease, stage 3a: Secondary | ICD-10-CM

## 2020-01-03 NOTE — Assessment & Plan Note (Signed)
Chronic Reviewed recent bone density scan She has been on Fosamax in the past and did not tolerate it She is taking calcium and vitamin D-continue.  We will check her vitamin D level with her next blood work Advised light hand weights to help improve her bone strength in her upper extremities Encouraged as much walk walking and activity as possible-she is limited by osteoarthritis Discussed Prolia-I think this would be the best option for her.  She is concerned about possible side effects because there are many medication she does not tolerate Information given about Prolia and she will consider this Discussed fall prevention

## 2020-01-03 NOTE — Assessment & Plan Note (Signed)
Chronic Slowly progressive Blood pressure well controlled, no diabetes Does not take any NSAIDs She has not been good about drinking enough fluids for a long time due to her overactive bladder-recently she has made more of an effort to increase her fluids and that did improve her kidney function slightly She will continue to push fluids Ultrasound recently showed bilateral kidney atrophy, but no other concerns Follow-up in 4 months

## 2020-01-03 NOTE — Patient Instructions (Addendum)
Your bone density shows osteoporosis.   Start using light hand weights to help improve your arm bone strength.  Think about prolia for your bones.     Continue increased fluids for your kidneys.   Follow up in 4 months.    Denosumab injection ( prolia) What is this medicine? DENOSUMAB (den oh sue mab) slows bone breakdown. Prolia is used to treat osteoporosis in women after menopause and in men, and in people who are taking corticosteroids for 6 months or more. Delton See is used to treat a high calcium level due to cancer and to prevent bone fractures and other bone problems caused by multiple myeloma or cancer bone metastases. Delton See is also used to treat giant cell tumor of the bone. This medicine may be used for other purposes; ask your health care provider or pharmacist if you have questions. COMMON BRAND NAME(S): Prolia, XGEVA What should I tell my health care provider before I take this medicine? They need to know if you have any of these conditions:  dental disease  having surgery or tooth extraction  infection  kidney disease  low levels of calcium or Vitamin D in the blood  malnutrition  on hemodialysis  skin conditions or sensitivity  thyroid or parathyroid disease  an unusual reaction to denosumab, other medicines, foods, dyes, or preservatives  pregnant or trying to get pregnant  breast-feeding How should I use this medicine? This medicine is for injection under the skin. It is given by a health care professional in a hospital or clinic setting. A special MedGuide will be given to you before each treatment. Be sure to read this information carefully each time. For Prolia, talk to your pediatrician regarding the use of this medicine in children. Special care may be needed. For Delton See, talk to your pediatrician regarding the use of this medicine in children. While this drug may be prescribed for children as young as 13 years for selected conditions, precautions do  apply. Overdosage: If you think you have taken too much of this medicine contact a poison control center or emergency room at once. NOTE: This medicine is only for you. Do not share this medicine with others. What if I miss a dose? It is important not to miss your dose. Call your doctor or health care professional if you are unable to keep an appointment. What may interact with this medicine? Do not take this medicine with any of the following medications:  other medicines containing denosumab This medicine may also interact with the following medications:  medicines that lower your chance of fighting infection  steroid medicines like prednisone or cortisone This list may not describe all possible interactions. Give your health care provider a list of all the medicines, herbs, non-prescription drugs, or dietary supplements you use. Also tell them if you smoke, drink alcohol, or use illegal drugs. Some items may interact with your medicine. What should I watch for while using this medicine? Visit your doctor or health care professional for regular checks on your progress. Your doctor or health care professional may order blood tests and other tests to see how you are doing. Call your doctor or health care professional for advice if you get a fever, chills or sore throat, or other symptoms of a cold or flu. Do not treat yourself. This drug may decrease your body's ability to fight infection. Try to avoid being around people who are sick. You should make sure you get enough calcium and vitamin D while you are  taking this medicine, unless your doctor tells you not to. Discuss the foods you eat and the vitamins you take with your health care professional. See your dentist regularly. Brush and floss your teeth as directed. Before you have any dental work done, tell your dentist you are receiving this medicine. Do not become pregnant while taking this medicine or for 5 months after stopping it. Talk  with your doctor or health care professional about your birth control options while taking this medicine. Women should inform their doctor if they wish to become pregnant or think they might be pregnant. There is a potential for serious side effects to an unborn child. Talk to your health care professional or pharmacist for more information. What side effects may I notice from receiving this medicine? Side effects that you should report to your doctor or health care professional as soon as possible:  allergic reactions like skin rash, itching or hives, swelling of the face, lips, or tongue  bone pain  breathing problems  dizziness  jaw pain, especially after dental work  redness, blistering, peeling of the skin  signs and symptoms of infection like fever or chills; cough; sore throat; pain or trouble passing urine  signs of low calcium like fast heartbeat, muscle cramps or muscle pain; pain, tingling, numbness in the hands or feet; seizures  unusual bleeding or bruising  unusually weak or tired Side effects that usually do not require medical attention (report to your doctor or health care professional if they continue or are bothersome):  constipation  diarrhea  headache  joint pain  loss of appetite  muscle pain  runny nose  tiredness  upset stomach This list may not describe all possible side effects. Call your doctor for medical advice about side effects. You may report side effects to FDA at 1-800-FDA-1088. Where should I keep my medicine? This medicine is only given in a clinic, doctor's office, or other health care setting and will not be stored at home. NOTE: This sheet is a summary. It may not cover all possible information. If you have questions about this medicine, talk to your doctor, pharmacist, or health care provider.  2020 Elsevier/Gold Standard (2017-07-07 16:10:44)

## 2020-01-22 DIAGNOSIS — N1831 Chronic kidney disease, stage 3a: Secondary | ICD-10-CM | POA: Diagnosis not present

## 2020-01-22 DIAGNOSIS — I129 Hypertensive chronic kidney disease with stage 1 through stage 4 chronic kidney disease, or unspecified chronic kidney disease: Secondary | ICD-10-CM | POA: Diagnosis not present

## 2020-01-23 DIAGNOSIS — N1831 Chronic kidney disease, stage 3a: Secondary | ICD-10-CM | POA: Diagnosis not present

## 2020-01-24 ENCOUNTER — Telehealth: Payer: Self-pay | Admitting: Internal Medicine

## 2020-01-24 NOTE — Telephone Encounter (Signed)
Spoke with patient today. She was going to send Dr. Lawerance Bach a my-chart message with everything that she wanted taken off.

## 2020-01-24 NOTE — Telephone Encounter (Signed)
Patient wants a call to discuss things showing on her My chart that are not accurate. Still showing as a  tobacco user and she wants to discuss this and other items shown on the AVS from the specialist she was sent to  Please call the patient to discuss at [phone]

## 2020-05-03 NOTE — Progress Notes (Signed)
Subjective:    Patient ID: Barbara [name], female    DOB: 1932-05-09, 87 y.o.   MRN: [id number]  HPI The patient is here for follow up of their chronic medical problems, including htn, prediabetes, osteoporosis, CKD   progressive thoracic back pain.  She feels it when walking too long or putting pressure on spine.   B/l wrist pain.    Taking tylenol only.   Has to limit activity.  She is doing some walking - not doing it as much as she used to.    Medications and allergies reviewed with patient and updated if appropriate.  Patient Active Problem List   Diagnosis Date Noted  . CKD (chronic kidney disease) stage 3, GFR 30-59 ml/min (HCC) 12/11/2019  . BPPV (benign paroxysmal positional vertigo), right 07/24/2018  . Left lumbar radiculopathy 05/09/2018  . Erythema of skin 05/09/2018  . Sensorineural hearing loss (SNHL), bilateral 01/31/2018  . Trochanteric bursitis of left hip 01/18/2018  . Nephrolithiasis 01/17/2018  . Left-sided thoracic back pain 01/01/2018  . Prediabetes 06/25/2016  . Aortic atherosclerosis (HCC) 06/22/2016  . Osteoporosis 06/22/2016  . Bilateral primary osteoarthritis of first carpometacarpal joints 08/17/2015  . Hypertension 03/18/2015  . Temporal arteritis (HCC) 01/05/2015  . Aneurysm, ophthalmic artery 12/11/2014    Current Outpatient Medications on File Prior to Visit  Medication Sig Dispense Refill  . benazepril (LOTENSIN) 5 MG tablet Take 0.5 tablets (2.5 mg total) by mouth daily. 90 tablet 1  . Calcium-Vitamin D-Vitamin K 500-500-40 MG-UNT-MCG CHEW Chew by mouth. TAKE 2 A DAY    . diclofenac sodium (VOLTAREN) 1 % GEL     . fish oil-omega-3 fatty acids 1000 MG capsule Take 2 g by mouth daily.    . Methylsulfonylmethane (MSM PO) Take by mouth.    . NON FORMULARY Slimfast powder    . Nutritional Supplements (ANTI-INFLAMMATORY ENZYME) CAPS APPLY 1 2 GMS TO AFFECTED AREA 3 4 TIMES DAILY AS NEEDED FOR PAIN  THIS IS A COMPOUNDED CREAM 1 PUMP EQUALS  1 GRAM  1  . Vitamin D3 (VITAMIN D) 25 MCG tablet Take 1,000 Units by mouth daily.     No current facility-administered medications on file prior to visit.    Past Medical History:  Diagnosis Date  . Aneurysm, ophthalmic artery   . Colonic polyp   . Diverticulosis   . Hypertension   . Urine incontinence     Past Surgical History:  Procedure Laterality Date  . disk removal      Social History   Socioeconomic History  . Marital status: Widowed    Spouse name: Not on file  . Number of children: Not on file  . Years of education: Not on file  . Highest education level: Not on file  Occupational History  . Not on file  Tobacco Use  . Smoking status: Former Games developer  . Smokeless tobacco: Former Engineer, water and Sexual Activity  . Alcohol use: No  . Drug use: No  . Sexual activity: Not on file  Other Topics Concern  . Not on file  Social History Narrative  . Not on file   Social Determinants of Health   Financial Resource Strain: Not on file  Food Insecurity: Not on file  Transportation Needs: Not on file  Physical Activity: Not on file  Stress: Not on file  Social Connections: Not on file    History reviewed. No pertinent family history.  Review of Systems  Constitutional: Negative for fever.  Respiratory:  Negative for cough, shortness of breath and wheezing.   Cardiovascular: Negative for chest pain, palpitations and leg swelling.  Genitourinary:       Incontinence  Neurological: Negative for light-headedness and headaches.       Objective:   Vitals:   05/04/20 1133  BP: 120/68  Pulse: 68  Temp: 98.1 F (36.7 C)  SpO2: 97%   BP Readings from Last 3 Encounters:  05/04/20 120/68  01/03/20 114/78  12/11/19 102/64   Wt Readings from Last 3 Encounters:  05/04/20 143 lb 3.2 oz (65 kg)  01/03/20 143 lb (64.9 kg)  12/11/19 147 lb 3.2 oz (66.8 kg)   Body mass index is 27.06 kg/m.   Physical Exam    Constitutional: Appears well-developed and  well-nourished. No distress.  HENT:  Head: Normocephalic and atraumatic.  Neck: Neck supple. No tracheal deviation present. No thyromegaly present.  No cervical lymphadenopathy Cardiovascular: Normal rate, regular rhythm and normal heart sounds.   No murmur heard. No carotid bruit .  No edema Pulmonary/Chest: Effort normal and breath sounds normal. No respiratory distress. No has no wheezes. No rales.  Skin: Skin is warm and dry. Not diaphoretic.  Psychiatric: Normal mood and affect. Behavior is normal.      Assessment & Plan:    See Problem List for Assessment and Plan of chronic medical problems.    This visit occurred during the SARS-CoV-2 public health emergency.  Safety protocols were in place, including screening questions prior to the visit, additional usage of staff PPE, and extensive cleaning of exam room while observing appropriate contact time as indicated for disinfecting solutions.

## 2020-05-03 NOTE — Patient Instructions (Addendum)
    Blood work was ordered.     Medications changes include :  none   Your prescription(s) have been submitted to your pharmacy. Please take as directed and contact our office if you believe you are having problem(s) with the medication(s).   Please followup in 6 months  

## 2020-05-04 ENCOUNTER — Other Ambulatory Visit: Payer: Self-pay

## 2020-05-04 ENCOUNTER — Ambulatory Visit (INDEPENDENT_AMBULATORY_CARE_PROVIDER_SITE_OTHER): Payer: Medicare Other | Admitting: Internal Medicine

## 2020-05-04 ENCOUNTER — Encounter: Payer: Self-pay | Admitting: Internal Medicine

## 2020-05-04 VITALS — BP 120/68 | HR 68 | Temp 98.1°F | Ht 61.0 in | Wt 143.2 lb

## 2020-05-04 DIAGNOSIS — I700 Atherosclerosis of aorta: Secondary | ICD-10-CM

## 2020-05-04 DIAGNOSIS — N1831 Chronic kidney disease, stage 3a: Secondary | ICD-10-CM

## 2020-05-04 DIAGNOSIS — I10 Essential (primary) hypertension: Secondary | ICD-10-CM

## 2020-05-04 DIAGNOSIS — M810 Age-related osteoporosis without current pathological fracture: Secondary | ICD-10-CM

## 2020-05-04 DIAGNOSIS — R7303 Prediabetes: Secondary | ICD-10-CM

## 2020-05-04 LAB — CBC WITH DIFFERENTIAL/PLATELET
Basophils Absolute: 0.1 10*3/uL (ref 0.0–0.1)
Basophils Relative: 1.1 % (ref 0.0–3.0)
Eosinophils Absolute: 0.2 10*3/uL (ref 0.0–0.7)
Eosinophils Relative: 2.7 % (ref 0.0–5.0)
HCT: 41.1 % (ref 36.0–46.0)
Hemoglobin: 13.8 g/dL (ref 12.0–15.0)
Lymphocytes Relative: 23.2 % (ref 12.0–46.0)
Lymphs Abs: 1.3 10*3/uL (ref 0.7–4.0)
MCHC: 33.5 g/dL (ref 30.0–36.0)
MCV: 91.6 fl (ref 78.0–100.0)
Monocytes Absolute: 0.6 10*3/uL (ref 0.1–1.0)
Monocytes Relative: 11.2 % (ref 3.0–12.0)
Neutro Abs: 3.5 10*3/uL (ref 1.4–7.7)
Neutrophils Relative %: 61.8 % (ref 43.0–77.0)
Platelets: 175 10*3/uL (ref 150.0–400.0)
RBC: 4.49 Mil/uL (ref 3.87–5.11)
RDW: 13.9 % (ref 11.5–15.5)
WBC: 5.7 10*3/uL (ref 4.0–10.5)

## 2020-05-04 LAB — COMPREHENSIVE METABOLIC PANEL
ALT: 11 U/L (ref 0–35)
AST: 17 U/L (ref 0–37)
Albumin: 4.2 g/dL (ref 3.5–5.2)
Alkaline Phosphatase: 81 U/L (ref 39–117)
BUN: 34 mg/dL — ABNORMAL HIGH (ref 6–23)
CO2: 30 mEq/L (ref 19–32)
Calcium: 10.7 mg/dL — ABNORMAL HIGH (ref 8.4–10.5)
Chloride: 103 mEq/L (ref 96–112)
Creatinine, Ser: 1.15 mg/dL (ref 0.40–1.20)
GFR: 43.5 mL/min — ABNORMAL LOW (ref >60.00)
Glucose, Bld: 73 mg/dL (ref 70–99)
Potassium: 5.2 mEq/L — ABNORMAL HIGH (ref 3.5–5.1)
Sodium: 141 mEq/L (ref 135–145)
Total Bilirubin: 1.2 mg/dL (ref 0.2–1.2)
Total Protein: 7 g/dL (ref 6.0–8.3)

## 2020-05-04 LAB — HEMOGLOBIN A1C: Hgb A1c MFr Bld: 5.6 % (ref 4.6–6.5)

## 2020-05-04 MED ORDER — BENAZEPRIL HCL 5 MG PO TABS
2.5000 mg | ORAL_TABLET | Freq: Every day | ORAL | 3 refills | Status: DC
Start: 2020-05-04 — End: 2020-05-11

## 2020-05-04 NOTE — Assessment & Plan Note (Addendum)
Chronic BP well controlled occ low BP at home  - advised to hold medication if SBP < 110 Continue bystolic 2.5 mg daily cmp

## 2020-05-04 NOTE — Assessment & Plan Note (Signed)
Chronic Check a1c Low sugar / carb diet Stressed regular exercise  

## 2020-05-04 NOTE — Assessment & Plan Note (Signed)
Chronic BP well controlled Only taking tylenol for pain Advised increased fluids

## 2020-05-11 ENCOUNTER — Encounter: Payer: Self-pay | Admitting: Internal Medicine

## 2020-05-27 ENCOUNTER — Encounter: Payer: Self-pay | Admitting: Internal Medicine

## 2020-06-12 ENCOUNTER — Encounter: Payer: Self-pay | Admitting: Internal Medicine

## 2020-06-13 MED ORDER — BENAZEPRIL HCL 5 MG PO TABS
2.5000 mg | ORAL_TABLET | Freq: Every day | ORAL | 3 refills | Status: DC
Start: 2020-06-13 — End: 2020-07-09

## 2020-06-23 NOTE — Progress Notes (Signed)
Subjective:    Patient ID: Barbara [name], female    DOB: 14-May-1932, 87 y.o.   MRN: [id number]  HPI The patient is here for an acute visit to discuss her BP.     She checks her BP three times in a row.  Her BP varies.  Sometimes her blood pressure is as low as high 90s systolically and other times it can be up to the 160s and rarely 170 something.  She has not been able to find any pattern to it.  On occasion she holds her medication if her blood pressure is low, but otherwise takes 2.5 mg of the benazepril daily.  She is not symptomatic with high blood pressures and occasionally may feel slightly symptomatic if the blood pressure is on the low side.  4/11 - 117/73, 103/67, 107/66 4/12 - 152/82, 155/85, 154/84 4/13 143/79, 136/73, 134/74  BP high 90's - 168 / 70-80's  She is compliant with a low sodium diet.  She is not exercising due to her bursitis.     She has anxiety.  She gets anxious at times when she has to do something - she will start to get anxious a few days prior.  She belongs to a prayer group and some days she feels claustrophobic and if someone asks her something she turns red.  She thought it was depression, but not thinks it is anxiety.  She has had some cold symptoms for over a week.  She is congestion and is blowing her nose a lot and also coughing a lot.  She typically does not get allergies so does not think it is allergies.  Her symptoms have not been getting better and that has concerned her.  Medications and allergies reviewed with patient and updated if appropriate.  Patient Active Problem List   Diagnosis Date Noted  . Anxiety 06/24/2020  . CKD (chronic kidney disease) stage 3, GFR 30-59 ml/min (HCC) 12/11/2019  . BPPV (benign paroxysmal positional vertigo), right 07/24/2018  . Left lumbar radiculopathy 05/09/2018  . Erythema of skin 05/09/2018  . Sensorineural hearing loss (SNHL), bilateral 01/31/2018  . Trochanteric bursitis of left hip 01/18/2018   . Nephrolithiasis 01/17/2018  . Left-sided thoracic back pain 01/01/2018  . Prediabetes 06/25/2016  . Aortic atherosclerosis (HCC) 06/22/2016  . Osteoporosis 06/22/2016  . Bilateral primary osteoarthritis of first carpometacarpal joints 08/17/2015  . Hypertension 03/18/2015  . Temporal arteritis (HCC) 01/05/2015  . Aneurysm, ophthalmic artery 12/11/2014    Current Outpatient Medications on File Prior to Visit  Medication Sig Dispense Refill  . benazepril (LOTENSIN) 5 MG tablet Take 0.5 tablets (2.5 mg total) by mouth daily. 45 tablet 3  . Calcium-Vitamin D-Vitamin K 500-500-40 MG-UNT-MCG CHEW Chew by mouth. TAKE 2 A DAY    . diclofenac sodium (VOLTAREN) 1 % GEL     . fish oil-omega-3 fatty acids 1000 MG capsule Take 2 g by mouth daily.    . Methylsulfonylmethane (MSM PO) Take by mouth.    . NON FORMULARY Slimfast powder    . Nutritional Supplements (ANTI-INFLAMMATORY ENZYME) CAPS APPLY 1 2 GMS TO AFFECTED AREA 3 4 TIMES DAILY AS NEEDED FOR PAIN  THIS IS A COMPOUNDED CREAM 1 PUMP EQUALS 1 GRAM  1  . Vitamin D3 (VITAMIN D) 25 MCG tablet Take 1,000 Units by mouth daily.     No current facility-administered medications on file prior to visit.    Past Medical History:  Diagnosis Date  . Aneurysm, ophthalmic artery   .  Colonic polyp   . Diverticulosis   . Hypertension   . Urine incontinence     Past Surgical History:  Procedure Laterality Date  . disk removal      Social History   Socioeconomic History  . Marital status: Widowed    Spouse name: Not on file  . Number of children: Not on file  . Years of education: Not on file  . Highest education level: Not on file  Occupational History  . Not on file  Tobacco Use  . Smoking status: Former Games developer  . Smokeless tobacco: Former Engineer, water and Sexual Activity  . Alcohol use: No  . Drug use: No  . Sexual activity: Not on file  Other Topics Concern  . Not on file  Social History Narrative  . Not on file   Social  Determinants of Health   Financial Resource Strain: Not on file  Food Insecurity: Not on file  Transportation Needs: Not on file  Physical Activity: Not on file  Stress: Not on file  Social Connections: Not on file    History reviewed. No pertinent family history.  Review of Systems  Constitutional: Negative for fever.  HENT: Positive for congestion and postnasal drip.   Respiratory: Positive for cough. Negative for shortness of breath and wheezing.   Cardiovascular: Positive for palpitations (occ). Negative for chest pain and leg swelling.  Neurological: Negative for dizziness, light-headedness and headaches.       Objective:   Vitals:   06/24/20 1015  BP: 128/64  Pulse: 95  Temp: 98.1 F (36.7 C)  SpO2: 98%   BP Readings from Last 3 Encounters:  06/24/20 128/64  05/04/20 120/68  01/03/20 114/78   Wt Readings from Last 3 Encounters:  06/24/20 143 lb (64.9 kg)  05/04/20 143 lb 3.2 oz (65 kg)  01/03/20 143 lb (64.9 kg)   Body mass index is 27.02 kg/m.   Physical Exam    Constitutional: Appears well-developed and well-nourished. No distress.  Head: Normocephalic and atraumatic.  Neck: Neck supple. No tracheal deviation present. No thyromegaly present.  No cervical lymphadenopathy Cardiovascular: Normal rate, regular rhythm and normal heart sounds.  No murmur heard. No carotid bruit .  No edema Pulmonary/Chest: Effort normal.  No respiratory distress.  Slight rhonchi left lower lung.  No obvious wheeze or crackles Skin: Skin is warm and dry. Not diaphoretic.  Psychiatric: Normal mood and affect. Behavior is normal.       Assessment & Plan:    See Problem List for Assessment and Plan of chronic medical problems.    This visit occurred during the SARS-CoV-2 public health emergency.  Safety protocols were in place, including screening questions prior to the visit, additional usage of staff PPE, and extensive cleaning of exam room while observing appropriate  contact time as indicated for disinfecting solutions.

## 2020-06-24 ENCOUNTER — Encounter: Payer: Self-pay | Admitting: Internal Medicine

## 2020-06-24 ENCOUNTER — Other Ambulatory Visit: Payer: Self-pay

## 2020-06-24 ENCOUNTER — Ambulatory Visit (INDEPENDENT_AMBULATORY_CARE_PROVIDER_SITE_OTHER): Payer: Medicare Other | Admitting: Internal Medicine

## 2020-06-24 VITALS — BP 128/64 | HR 95 | Temp 98.1°F | Ht 61.0 in | Wt 143.0 lb

## 2020-06-24 DIAGNOSIS — J22 Unspecified acute lower respiratory infection: Secondary | ICD-10-CM

## 2020-06-24 DIAGNOSIS — I10 Essential (primary) hypertension: Secondary | ICD-10-CM

## 2020-06-24 DIAGNOSIS — F419 Anxiety disorder, unspecified: Secondary | ICD-10-CM | POA: Diagnosis not present

## 2020-06-24 MED ORDER — ESCITALOPRAM OXALATE 5 MG PO TABS: 5.0000 mg | ORAL_TABLET | Freq: Every day | ORAL | 5 refills | Status: DC

## 2020-06-24 MED ORDER — NEBIVOLOL HCL 2.5 MG PO TABS: 2.5000 mg | ORAL_TABLET | Freq: Every day | ORAL | 3 refills | Status: DC

## 2020-06-24 MED ORDER — DOXYCYCLINE HYCLATE 100 MG PO TABS: 100.0000 mg | ORAL_TABLET | Freq: Two times a day (BID) | ORAL | 0 refills | Status: DC

## 2020-06-24 NOTE — Assessment & Plan Note (Signed)
Chronic Blood pressure is variable for no obvious reason-systolic blood pressure can vary from low 100s to high 160s She is compliant with a low-sodium diet She is not able to exercise secondary to bursitis Taking benazepril 2.5 mg daily and hold statin when her blood pressure is very low Discussed trying a different medication to see if that would level her blood pressure out Trial of Bystolic 2.5 mg daily-hold benazepril.  If blood pressure is not better controlled can go back to the benazepril Hold medication if blood pressure is very low

## 2020-06-24 NOTE — Assessment & Plan Note (Signed)
New problem She likely has a combination of both some anxiety and depression, which is mostly situational She turns very red in certain situations, typically when she is asked a question in a small group setting and she thinks this goes back to things that happened in her childhood Discussed trying Lexapro 5 mg daily to see if that helps and she is willing to try it

## 2020-06-24 NOTE — Patient Instructions (Addendum)
  Medications changes include :   lexapro 5 mg daily for anxiety.  Doxycycline for possible sinus infection.  bystolic for blood pressure.   Your prescription(s) have been submitted to your pharmacy. Please take as directed and contact our office if you believe you are having problem(s) with the medication(s).   Please followup in august as scheduled, sooner if needed

## 2020-06-24 NOTE — Assessment & Plan Note (Signed)
Acute Cold symptoms going on for about 10 days and not improving No history of allergies She does have a little bit of chest congestion in her left lower lung, but oxygen saturation is good and she does not appear to have pneumonia Discussed symptomatic treatment over-the-counter Start doxycycline 100 mg twice daily Call if no improvement

## 2020-07-09 ENCOUNTER — Other Ambulatory Visit: Payer: Self-pay | Admitting: Internal Medicine

## 2020-07-09 MED ORDER — BENAZEPRIL HCL 5 MG PO TABS
5.0000 mg | ORAL_TABLET | Freq: Every day | ORAL | 1 refills | Status: DC
Start: 2020-07-09 — End: 2021-07-20

## 2020-07-13 DIAGNOSIS — N1831 Chronic kidney disease, stage 3a: Secondary | ICD-10-CM | POA: Diagnosis not present

## 2020-07-13 DIAGNOSIS — I129 Hypertensive chronic kidney disease with stage 1 through stage 4 chronic kidney disease, or unspecified chronic kidney disease: Secondary | ICD-10-CM | POA: Diagnosis not present

## 2020-07-14 ENCOUNTER — Telehealth: Payer: Self-pay

## 2020-07-14 NOTE — Telephone Encounter (Signed)
Called pt to schedule an AWV with the health coach. Left pt a voicemail to call and schedule appt.

## 2020-07-30 ENCOUNTER — Encounter: Payer: Self-pay | Admitting: Internal Medicine

## 2020-07-30 MED ORDER — SERTRALINE HCL 25 MG PO TABS: 25.0000 mg | ORAL_TABLET | Freq: Every day | ORAL | 3 refills | Status: DC

## 2020-08-13 ENCOUNTER — Telehealth: Payer: Self-pay | Admitting: Internal Medicine

## 2020-08-13 MED ORDER — DULOXETINE HCL 20 MG PO CPEP: 20.0000 mg | ORAL_CAPSULE | Freq: Every day | ORAL | 3 refills | Status: DC

## 2020-08-13 NOTE — Telephone Encounter (Signed)
That med is an older med and not used that often.  Before trying that I would recommend increasing the sertraline to 50 mg daily and after 3 weeks if no improvement try 75 mg daily.  She is currently on a very low dose and she should try a higher dose first.

## 2020-08-13 NOTE — Telephone Encounter (Signed)
Patient called and said the dosage that she is on is strong enough. She said that it makes her feel like she can sleep standing up. She said that she would like to try something else. Please advise

## 2020-08-13 NOTE — Telephone Encounter (Signed)
Message left for patient today with info. If she calls back please see if she is fine with increasing dose and let me know.  Thanks

## 2020-08-13 NOTE — Telephone Encounter (Signed)
Message left for patient

## 2020-08-13 NOTE — Addendum Note (Signed)
Addended by: Pincus Sanes on: 08/13/2020 03:32 PM   Modules accepted: Orders

## 2020-08-13 NOTE — Telephone Encounter (Signed)
Patient called and said that sertraline (ZOLOFT) 25 MG tablet has not been helping her and she was wondering if Tofranil is still being used. She said that it can be sent to [facility] [place], [place] - [address] Please advise

## 2020-08-13 NOTE — Telephone Encounter (Signed)
Lets try cymbalta 20 mg daily - sent to [facility]

## 2020-11-10 ENCOUNTER — Other Ambulatory Visit: Payer: Self-pay

## 2020-11-10 ENCOUNTER — Ambulatory Visit (INDEPENDENT_AMBULATORY_CARE_PROVIDER_SITE_OTHER): Payer: Medicare Other

## 2020-11-10 DIAGNOSIS — Z0000 Encounter for general adult medical examination without abnormal findings: Secondary | ICD-10-CM

## 2020-11-10 NOTE — Patient Instructions (Signed)
Barbara [name] , Thank you for taking time to come for your Medicare Wellness Visit. I appreciate your ongoing commitment to your health goals. Please review the following plan we discussed and let me know if I can assist you in the future.   Screening recommendations/referrals: Colonoscopy: not a candidate for screening due to age Mammogram: last done 12/28/2012 Bone Density: last done 12/24/2019; due every 2 years Recommended yearly ophthalmology/optometry visit for glaucoma screening and checkup Recommended yearly dental visit for hygiene and checkup  Vaccinations: Influenza vaccine: declined Pneumococcal vaccine: 05/31/2004, 06/22/2016 Tdap vaccine: 01/01/2018; due every 10 years Shingles vaccine: never done; check with local pharmacy   Covid-19: 04/02/2019, 04/22/2019, 12/16/2019  Advanced directives: Please bring a copy of your health care power of attorney and living will to the office at your convenience.  Conditions/risks identified: Shingrix: Please call your insurance company to determine your out of pocket expense for the Shingrix vaccine. You may receive this vaccine at your local pharmacy.  Next appointment: 11/11/2021 at 8:40 am.   Preventive Care 65 Years and Older, Female Preventive care refers to lifestyle choices and visits with your health care provider that can promote health and wellness. What does preventive care include? A yearly physical exam. This is also called an annual well check. Dental exams once or twice a year. Routine eye exams. Ask your health care provider how often you should have your eyes checked. Personal lifestyle choices, including: Daily care of your teeth and gums. Regular physical activity. Eating a healthy diet. Avoiding tobacco and drug use. Limiting alcohol use. Practicing safe sex. Taking low-dose aspirin every day. Taking vitamin and mineral supplements as recommended by your health care provider. What happens during an annual well  check? The services and screenings done by your health care provider during your annual well check will depend on your age, overall health, lifestyle risk factors, and family history of disease. Counseling  Your health care provider may ask you questions about your: Alcohol use. Tobacco use. Drug use. Emotional well-being. Home and relationship well-being. Sexual activity. Eating habits. History of falls. Memory and ability to understand (cognition). Work and work Astronomer. Reproductive health. Screening  You may have the following tests or measurements: Height, weight, and BMI. Blood pressure. Lipid and cholesterol levels. These may be checked every 5 years, or more frequently if you are over 8 years old. Skin check. Lung cancer screening. You may have this screening every year starting at age 78 if you have a 30-pack-year history of smoking and currently smoke or have quit within the past 15 years. Fecal occult blood test (FOBT) of the stool. You may have this test every year starting at age 49. Flexible sigmoidoscopy or colonoscopy. You may have a sigmoidoscopy every 5 years or a colonoscopy every 10 years starting at age 19. Hepatitis C blood test. Hepatitis B blood test. Sexually transmitted disease (STD) testing. Diabetes screening. This is done by checking your blood sugar (glucose) after you have not eaten for a while (fasting). You may have this done every 1-3 years. Bone density scan. This is done to screen for osteoporosis. You may have this done starting at age 3. Mammogram. This may be done every 1-2 years. Talk to your health care provider about how often you should have regular mammograms. Talk with your health care provider about your test results, treatment options, and if necessary, the need for more tests. Vaccines  Your health care provider may recommend certain vaccines, such as: Influenza vaccine. This  is recommended every year. Tetanus, diphtheria, and  acellular pertussis (Tdap, Td) vaccine. You may need a Td booster every 10 years. Zoster vaccine. You may need this after age 37. Pneumococcal 13-valent conjugate (PCV13) vaccine. One dose is recommended after age 30. Pneumococcal polysaccharide (PPSV23) vaccine. One dose is recommended after age 50. Talk to your health care provider about which screenings and vaccines you need and how often you need them. This information is not intended to replace advice given to you by your health care provider. Make sure you discuss any questions you have with your health care provider. Document Released: 03/27/2015 Document Revised: 11/18/2015 Document Reviewed: 12/30/2014 Elsevier Interactive Patient Education  2017 [employer] Prevention in the Home Falls can cause injuries. They can happen to people of all ages. There are many things you can do to make your home safe and to help prevent falls. What can I do on the outside of my home? Regularly fix the edges of walkways and driveways and fix any cracks. Remove anything that might make you trip as you walk through a door, such as a raised step or threshold. Trim any bushes or trees on the path to your home. Use bright outdoor lighting. Clear any walking paths of anything that might make someone trip, such as rocks or tools. Regularly check to see if handrails are loose or broken. Make sure that both sides of any steps have handrails. Any raised decks and porches should have guardrails on the edges. Have any leaves, snow, or ice cleared regularly. Use sand or salt on walking paths during winter. Clean up any spills in your garage right away. This includes oil or grease spills. What can I do in the bathroom? Use night lights. Install grab bars by the toilet and in the tub and shower. Do not use towel bars as grab bars. Use non-skid mats or decals in the tub or shower. If you need to sit down in the shower, use a plastic, non-slip stool. Keep  the floor dry. Clean up any water that spills on the floor as soon as it happens. Remove soap buildup in the tub or shower regularly. Attach bath mats securely with double-sided non-slip rug tape. Do not have throw rugs and other things on the floor that can make you trip. What can I do in the bedroom? Use night lights. Make sure that you have a light by your bed that is easy to reach. Do not use any sheets or blankets that are too big for your bed. They should not hang down onto the floor. Have a firm chair that has side arms. You can use this for support while you get dressed. Do not have throw rugs and other things on the floor that can make you trip. What can I do in the kitchen? Clean up any spills right away. Avoid walking on wet floors. Keep items that you use a lot in easy-to-reach places. If you need to reach something above you, use a strong step stool that has a grab bar. Keep electrical cords out of the way. Do not use floor polish or wax that makes floors slippery. If you must use wax, use non-skid floor wax. Do not have throw rugs and other things on the floor that can make you trip. What can I do with my stairs? Do not leave any items on the stairs. Make sure that there are handrails on both sides of the stairs and use them. Fix handrails that  are broken or loose. Make sure that handrails are as long as the stairways. Check any carpeting to make sure that it is firmly attached to the stairs. Fix any carpet that is loose or worn. Avoid having throw rugs at the top or bottom of the stairs. If you do have throw rugs, attach them to the floor with carpet tape. Make sure that you have a light switch at the top of the stairs and the bottom of the stairs. If you do not have them, ask someone to add them for you. What else can I do to help prevent falls? Wear shoes that: Do not have high heels. Have rubber bottoms. Are comfortable and fit you well. Are closed at the toe. Do not  wear sandals. If you use a stepladder: Make sure that it is fully opened. Do not climb a closed stepladder. Make sure that both sides of the stepladder are locked into place. Ask someone to hold it for you, if possible. Clearly mark and make sure that you can see: Any grab bars or handrails. First and last steps. Where the edge of each step is. Use tools that help you move around (mobility aids) if they are needed. These include: Canes. Walkers. Scooters. Crutches. Turn on the lights when you go into a dark area. Replace any light bulbs as soon as they burn out. Set up your furniture so you have a clear path. Avoid moving your furniture around. If any of your floors are uneven, fix them. If there are any pets around you, be aware of where they are. Review your medicines with your doctor. Some medicines can make you feel dizzy. This can increase your chance of falling. Ask your doctor what other things that you can do to help prevent falls. This information is not intended to replace advice given to you by your health care provider. Make sure you discuss any questions you have with your health care provider. Document Released: 12/25/2008 Document Revised: 08/06/2015 Document Reviewed: 04/04/2014 Elsevier Interactive Patient Education  2017 [employer].

## 2020-11-10 NOTE — Progress Notes (Signed)
Subjective:    Patient ID: Barbara [name], female    DOB: 12-Oct-1932, 87 y.o.   MRN: [id number]   This visit occurred during the SARS-CoV-2 public health emergency.  Safety protocols were in place, including screening questions prior to the visit, additional usage of staff PPE, and extensive cleaning of exam room while observing appropriate contact time as indicated for disinfecting solutions.    HPI She is here for a physical exam.   She is taking the sertraline for the anxiety and it has helped.  She may have some side effects, but is not sure if it is from the medication  or not.       Medications and allergies reviewed with patient and updated if appropriate.  Patient Active Problem List   Diagnosis Date Noted   Anxiety 06/24/2020   CKD (chronic kidney disease) stage 3, GFR 30-59 ml/min (HCC) 12/11/2019   BPPV (benign paroxysmal positional vertigo), right 07/24/2018   Left lumbar radiculopathy 05/09/2018   Erythema of skin 05/09/2018   Sensorineural hearing loss (SNHL), bilateral 01/31/2018   Trochanteric bursitis of left hip 01/18/2018   Nephrolithiasis 01/17/2018   Left-sided thoracic back pain 01/01/2018   Prediabetes 06/25/2016   Aortic atherosclerosis (HCC) 06/22/2016   Osteoporosis 06/22/2016   Bilateral primary osteoarthritis of first carpometacarpal joints 08/17/2015   Hypertension 03/18/2015   Temporal arteritis (HCC) 01/05/2015   Aneurysm, ophthalmic artery 12/11/2014    Current Outpatient Medications on File Prior to Visit  Medication Sig Dispense Refill   benazepril (LOTENSIN) 5 MG tablet Take 1 tablet (5 mg total) by mouth daily. 90 tablet 1   Calcium-Vitamin D-Vitamin K 500-500-40 MG-UNT-MCG CHEW Chew by mouth. TAKE 2 A DAY     diclofenac sodium (VOLTAREN) 1 % GEL      fish oil-omega-3 fatty acids 1000 MG capsule Take 2 g by mouth daily.     Methylsulfonylmethane (MSM PO) Take by mouth.     NON FORMULARY Slimfast powder     Nutritional Supplements  (ANTI-INFLAMMATORY ENZYME) CAPS APPLY 1 2 GMS TO AFFECTED AREA 3 4 TIMES DAILY AS NEEDED FOR PAIN  THIS IS A COMPOUNDED CREAM 1 PUMP EQUALS 1 GRAM  1   sertraline (ZOLOFT) 25 MG tablet Take 25 mg by mouth daily.     Vitamin D3 (VITAMIN D) 25 MCG tablet Take 1,000 Units by mouth daily.     No current facility-administered medications on file prior to visit.    Past Medical History:  Diagnosis Date   Aneurysm, ophthalmic artery    Colonic polyp    Diverticulosis    Hypertension    Urine incontinence     Past Surgical History:  Procedure Laterality Date   disk removal      Social History   Socioeconomic History   Marital status: Widowed    Spouse name: Not on file   Number of children: Not on file   Years of education: Not on file   Highest education level: Not on file  Occupational History   Not on file  Tobacco Use   Smoking status: Former   Smokeless tobacco: Former  Substance and Sexual Activity   Alcohol use: No   Drug use: No   Sexual activity: Not on file  Other Topics Concern   Not on file  Social History Narrative   Not on file   Social Determinants of Health   Financial Resource Strain: Low Risk    Difficulty of Paying Living Expenses: Not hard at  all  Food Insecurity: No Food Insecurity   Worried About Programme researcher, broadcasting/film/video in the Last Year: Never true   Ran Out of Food in the Last Year: Never true  Transportation Needs: No Transportation Needs   Lack of Transportation (Medical): No   Lack of Transportation (Non-Medical): No  Physical Activity: Sufficiently Active   Days of Exercise per Week: 5 days   Minutes of Exercise per Session: 30 min  Stress: No Stress Concern Present   Feeling of Stress : Not at all  Social Connections: Moderately Integrated   Frequency of Communication with Friends and Family: More than three times a week   Frequency of Social Gatherings with Friends and Family: More than three times a week   Attends Religious Services: More  than 4 times per year   Active Member of [employer] or Organizations: Yes   Attends Banker Meetings: More than 4 times per year   Marital Status: Widowed    History reviewed. No pertinent family history.  Review of Systems  Constitutional:  Negative for chills and fever.  Eyes:  Positive for visual disturbance (occ blurry vision).  Respiratory:  Negative for cough, shortness of breath and wheezing.   Cardiovascular:  Positive for palpitations (rare). Negative for chest pain and leg swelling.  Gastrointestinal:  Positive for diarrhea (occ). Negative for abdominal pain, blood in stool, constipation and nausea.       No gerd  Genitourinary:  Negative for dysuria.  Musculoskeletal:  Positive for arthralgias (wrist pain) and back pain (chronic).  Skin:  Negative for rash.  Neurological:  Negative for light-headedness and headaches.  Psychiatric/Behavioral:  Negative for dysphoric mood. The patient is nervous/anxious.       Objective:   Vitals:   11/11/20 0950  BP: 128/80  Pulse: 65  Temp: 98.1 F (36.7 C)  SpO2: 99%   Filed Weights   11/11/20 0950  Weight: 140 lb (63.5 kg)   Body mass index is 26.45 kg/m.  BP Readings from Last 3 Encounters:  11/11/20 128/80  06/24/20 128/64  05/04/20 120/68    Wt Readings from Last 3 Encounters:  11/11/20 140 lb (63.5 kg)  06/24/20 143 lb (64.9 kg)  05/04/20 143 lb 3.2 oz (65 kg)     Physical Exam Constitutional: She appears well-developed and well-nourished. No distress.  HENT:  Head: Normocephalic and atraumatic.  Right Ear: External ear normal. Normal ear canal and TM Left Ear: External ear normal.  Normal ear canal and TM Mouth/Throat: Oropharynx is clear and moist.  Eyes: Conjunctivae and EOM are normal.  Neck: Neck supple. No tracheal deviation present. No thyromegaly present.  No carotid bruit  Cardiovascular: Normal rate, regular rhythm and normal heart sounds.   No murmur heard.  No edema. Pulmonary/Chest:  Effort normal and breath sounds normal. No respiratory distress. She has no wheezes. She has no rales.  Breast: deferred   Abdominal: Soft. She exhibits no distension. There is no tenderness.  Lymphadenopathy: She has no cervical adenopathy.  Skin: Skin is warm and dry. She is not diaphoretic.  Psychiatric: She has a normal mood and affect. Her behavior is normal.     Lab Results  Component Value Date   WBC 5.7 05/04/2020   HGB 13.8 05/04/2020   HCT 41.1 05/04/2020   PLT 175.0 05/04/2020   GLUCOSE 73 05/04/2020   CHOL 217 (H) 12/11/2019   TRIG 191 (H) 12/11/2019   HDL 77 12/11/2019   LDLCALC 109 (H) 12/11/2019  ALT 11 05/04/2020   AST 17 05/04/2020   NA 141 05/04/2020   K 5.2 No hemolysis seen (H) 05/04/2020   CL 103 05/04/2020   CREATININE 1.15 05/04/2020   BUN 34 (H) 05/04/2020   CO2 30 05/04/2020   TSH 1.30 12/11/2019   HGBA1C 5.6 05/04/2020         Assessment & Plan:   Physical exam: Screening blood work  ordered Exercise  yard work, some walking Weight  good for her Substance abuse  none   Reviewed recommended immunizations.   Health Maintenance  Topic Date Due   Zoster Vaccines- Shingrix (1 of 2) Never done   COVID-19 Vaccine (4 - Booster for Pfizer series) 03/17/2020   DEXA SCAN  12/23/2021   TETANUS/TDAP  01/02/2028   PNA vac Low Risk Adult  Completed   HPV VACCINES  Aged Out   INFLUENZA VACCINE  Discontinued         See Problem List for Assessment and Plan of chronic medical problems.

## 2020-11-10 NOTE — Progress Notes (Signed)
I connected with Barbara [name] today by telephone and verified that I am speaking with the correct person using two identifiers. Location patient: home Location provider: work Persons participating in the virtual visit: patient, provider.   I discussed the limitations, risks, security and privacy concerns of performing an evaluation and management service by telephone and the availability of in person appointments. I also discussed with the patient that there may be a patient responsible charge related to this service. The patient expressed understanding and verbally consented to this telephonic visit.    Interactive audio and video telecommunications were attempted between this provider and patient, however failed, due to patient having technical difficulties OR patient did not have access to video capability.  We continued and completed visit with audio only.  Some vital signs may be absent or patient reported.   Time Spent with patient on telephone encounter: 30 minutes  Subjective:   Barbara [name] is a 87 y.o. female who presents for Medicare Annual (Subsequent) preventive examination.  Review of Systems     Cardiac Risk Factors include: advanced age (>4men, >76 women);hypertension     Objective:    There were no vitals filed for this visit. There is no height or weight on file to calculate BMI.  Advanced Directives 11/10/2020  Does Patient Have a Medical Advance Directive? Yes  Type of Advance Directive Living will;Healthcare Power of Attorney  Does patient want to make changes to medical advance directive? No - Patient declined  Copy of Healthcare Power of Attorney in Chart? No - copy requested    Current Medications (verified) Outpatient Encounter Medications as of 11/10/2020  Medication Sig   benazepril (LOTENSIN) 5 MG tablet Take 1 tablet (5 mg total) by mouth daily.   Calcium-Vitamin D-Vitamin K 500-500-40 MG-UNT-MCG CHEW Chew by mouth. TAKE 2 A DAY   diclofenac sodium  (VOLTAREN) 1 % GEL    DULoxetine (CYMBALTA) 20 MG capsule Take 1 capsule (20 mg total) by mouth daily.   fish oil-omega-3 fatty acids 1000 MG capsule Take 2 g by mouth daily.   Methylsulfonylmethane (MSM PO) Take by mouth.   NON FORMULARY Slimfast powder   Nutritional Supplements (ANTI-INFLAMMATORY ENZYME) CAPS APPLY 1 2 GMS TO AFFECTED AREA 3 4 TIMES DAILY AS NEEDED FOR PAIN  THIS IS A COMPOUNDED CREAM 1 PUMP EQUALS 1 GRAM   Vitamin D3 (VITAMIN D) 25 MCG tablet Take 1,000 Units by mouth daily.   No facility-administered encounter medications on file as of 11/10/2020.    Allergies (verified) Sertraline hcl, Amlodipine, Fosamax [alendronate], Lexapro [escitalopram], Metoprolol, and Telmisartan   History: Past Medical History:  Diagnosis Date   Aneurysm, ophthalmic artery    Colonic polyp    Diverticulosis    Hypertension    Urine incontinence    Past Surgical History:  Procedure Laterality Date   disk removal     History reviewed. No pertinent family history. Social History   Socioeconomic History   Marital status: Widowed    Spouse name: Not on file   Number of children: Not on file   Years of education: Not on file   Highest education level: Not on file  Occupational History   Not on file  Tobacco Use   Smoking status: Former   Smokeless tobacco: Former  Substance and Sexual Activity   Alcohol use: No   Drug use: No   Sexual activity: Not on file  Other Topics Concern   Not on file  Social History Narrative   Not  on file   Social Determinants of Health   Financial Resource Strain: Low Risk    Difficulty of Paying Living Expenses: Not hard at all  Food Insecurity: No Food Insecurity   Worried About Programme researcher, broadcasting/film/video in the Last Year: Never true   Barista in the Last Year: Never true  Transportation Needs: No Transportation Needs   Lack of Transportation (Medical): No   Lack of Transportation (Non-Medical): No  Physical Activity: Sufficiently Active    Days of Exercise per Week: 5 days   Minutes of Exercise per Session: 30 min  Stress: No Stress Concern Present   Feeling of Stress : Not at all  Social Connections: Moderately Integrated   Frequency of Communication with Friends and Family: More than three times a week   Frequency of Social Gatherings with Friends and Family: More than three times a week   Attends Religious Services: More than 4 times per year   Active Member of [employer] or Organizations: Yes   Attends Banker Meetings: More than 4 times per year   Marital Status: Widowed    Tobacco Counseling Counseling given: Not Answered   Clinical Intake:  Pre-visit preparation completed: Yes  Pain : No/denies pain     Nutritional Risks: None Diabetes: No  How often do you need to have someone help you when you read instructions, pamphlets, or other written materials from your doctor or pharmacy?: 1 - Never What is the last grade level you completed in school?: High School Graduate  Diabetic? no  Interpreter Needed?: No  Information entered by :: Barbara Cassette, LPN   Activities of Daily Living In your present state of health, do you have any difficulty performing the following activities: 11/10/2020 05/04/2020  Hearing? Y N  Vision? N N  Difficulty concentrating or making decisions? N N  Walking or climbing stairs? N N  Dressing or bathing? N N  Doing errands, shopping? N N  Preparing Food and eating ? N -  Using the Toilet? N -  In the past six months, have you accidently leaked urine? Y -  Do you have problems with loss of bowel control? N -  Managing your Medications? N -  Managing your Finances? N -  Housekeeping or managing your Housekeeping? N -  Some recent data might be hidden    Patient Care Team: Pincus Sanes, MD as PCP - General (Internal Medicine) Janalyn Harder, MD as Consulting Physician (Dermatology) Maris Berger, MD as Consulting Physician (Ophthalmology)  Indicate  any recent Medical Services you may have received from other than [facility] providers in the past year (date may be approximate).     Assessment:   This is a routine wellness examination for Barbara [name].  Hearing/Vision screen Hearing Screening - Comments:: Patient has bilateral hearing loss.  Wears hearing aids. Vision Screening - Comments:: Patient has lens implants.  Beings watch for minor macular degeneration.  Eye exam done by Dr. Maris Berger.  Dietary issues and exercise activities discussed: Current Exercise Habits: Home exercise routine, Type of exercise: walking, Time (Minutes): 30, Frequency (Times/Week): 5, Weekly Exercise (Minutes/Week): 150, Intensity: Moderate, Exercise limited by: orthopedic condition(s)   Goals Addressed               This Visit's Progress     Patient Stated (pt-stated)        My goal is to continue to stay independent, physically and socially active.      Depression Screen  PHQ 2/9 Scores 11/10/2020 06/24/2020 06/10/2019 01/01/2018 01/01/2018 01/01/2016  PHQ - 2 Score 0 0 0 0 0 0  PHQ- 9 Score - - - 0 - -    Fall Risk Fall Risk  11/10/2020 06/24/2020 06/10/2019 05/09/2018 01/01/2018  Falls in the past year? 0 0 0 0 No  Number falls in past yr: 0 0 0 - -  Injury with Fall? 0 0 0 - -  Risk for fall due to : No Fall Risks No Fall Risks - - -  Follow up Falls evaluation completed Falls evaluation completed - - -    FALL RISK PREVENTION PERTAINING TO THE HOME:  Any stairs in or around the home? Yes  If so, are there any without handrails? No  Home free of loose throw rugs in walkways, pet beds, electrical cords, etc? Yes  Adequate lighting in your home to reduce risk of falls? Yes   ASSISTIVE DEVICES UTILIZED TO PREVENT FALLS:  Life alert? No  Use of a cane, walker or w/c? No  Grab bars in the bathroom? Yes  Shower chair or bench in shower? No  Elevated toilet seat or a handicapped toilet? Yes   TIMED UP AND GO:  Was the test performed? No .   Length of time to ambulate 10 feet: n/a sec.   Gait steady and fast without use of assistive device  Cognitive Function: Normal cognitive status assessed by direct observation by this Nurse Health Advisor. No abnormalities found.          Immunizations Immunization History  Administered Date(s) Administered   PFIZER(Purple Top)SARS-COV-2 Vaccination 04/02/2019, 04/22/2019, 12/16/2019   Pneumococcal Conjugate-13 06/22/2016   Pneumococcal Polysaccharide-23 05/31/2004   Td 05/31/2004   Tdap 01/01/2018   Zoster, Live 12/12/2012    TDAP status: Completed at today's visit  Flu Vaccine status: Declined, Education has been provided regarding the importance of this vaccine but patient still declined. Advised may receive this vaccine at local pharmacy or Health Dept. Aware to provide a copy of the vaccination record if obtained from local pharmacy or Health Dept. Verbalized acceptance and understanding.  Pneumococcal vaccine status: Up to date  Covid-19 vaccine status: Completed vaccines  Qualifies for Shingles Vaccine? Yes   Zostavax completed Yes   Shingrix Completed?: No.    Education has been provided regarding the importance of this vaccine. Patient has been advised to call insurance company to determine out of pocket expense if they have not yet received this vaccine. Advised may also receive vaccine at local pharmacy or Health Dept. Verbalized acceptance and understanding.  Screening Tests Health Maintenance  Topic Date Due   Zoster Vaccines- Shingrix (1 of 2) Never done   COVID-19 Vaccine (4 - Booster for Pfizer series) 03/17/2020   DEXA SCAN  12/23/2021   TETANUS/TDAP  01/02/2028   PNA vac Low Risk Adult  Completed   HPV VACCINES  Aged Out   INFLUENZA VACCINE  Discontinued    Health Maintenance  Health Maintenance Due  Topic Date Due   Zoster Vaccines- Shingrix (1 of 2) Never done   COVID-19 Vaccine (4 - Booster for Pfizer series) 03/17/2020    Colorectal cancer  screening: No longer required.   Mammogram status: No longer required due to age.  Bone Density status: Completed 12/24/2019. Results reflect: Bone density results: OSTEOPOROSIS. Repeat every 2 years.  Lung Cancer Screening: (Low Dose CT Chest recommended if Age 74-80 years, 30 pack-year currently smoking OR have quit w/in 15years.) does not qualify.  Lung Cancer Screening Referral: no  Additional Screening:  Hepatitis C Screening: does not qualify; Completed no  Vision Screening: Recommended annual ophthalmology exams for early detection of glaucoma and other disorders of the eye. Is the patient up to date with their annual eye exam?  Yes  Who is the provider or what is the name of the office in which the patient attends annual eye exams? Maris Bergerhristine McCuen, MD. If pt is not established with a provider, would they like to be referred to a provider to establish care? No .   Dental Screening: Recommended annual dental exams for proper oral hygiene  Community Resource Referral / Chronic Care Management: CRR required this visit?  No   CCM required this visit?  No      Plan:     I have personally reviewed and noted the following in the patient's chart:   Medical and social history Use of alcohol, tobacco or illicit drugs  Current medications and supplements including opioid prescriptions.  Functional ability and status Nutritional status Physical activity Advanced directives List of other physicians Hospitalizations, surgeries, and ER visits in previous 12 months Vitals Screenings to include cognitive, depression, and falls Referrals and appointments  In addition, I have reviewed and discussed with patient certain preventive protocols, quality metrics, and best practice recommendations. A written personalized care plan for preventive services as well as general preventive health recommendations were provided to patient.     Mickeal NeedyShenika N Caster [place], LPN   [id number]/2022   Nurse  Notes:  Hearing Screening - Comments:: Patient has bilateral hearing loss.  Wears hearing aids. Vision Screening - Comments:: Patient has lens implants.  Beings watch for minor macular degeneration.  Eye exam done by Dr. Maris Bergerhristine McCuen. Patient is cogitatively intact. There were no vitals filed for this visit. There is no height or weight on file to calculate BMI. Patient stated that she has no issues with gait or balance; does not use any assistive devices. Medications reviewed with patient; no opioid use noted.

## 2020-11-10 NOTE — Patient Instructions (Addendum)
Blood work was ordered.     Get the shingles vaccine at the pharmacy.   Medications changes include :  none     Please followup in 1 year   Health Maintenance, Female Adopting a healthy lifestyle and getting preventive care are important in promoting health and wellness. Ask your health care provider about: The right schedule for you to have regular tests and exams. Things you can do on your own to prevent diseases and keep yourself healthy. What should I know about diet, weight, and exercise? Eat a healthy diet  Eat a diet that includes plenty of vegetables, fruits, low-fat dairy products, and lean protein. Do not eat a lot of foods that are high in solid fats, added sugars, or sodium.  Maintain a healthy weight Body mass index (BMI) is used to identify weight problems. It estimates body fat based on height and weight. Your health care provider can help determineyour BMI and help you achieve or maintain a healthy weight. Get regular exercise Get regular exercise. This is one of the most important things you can do for your health. Most adults should: Exercise for at least 150 minutes each week. The exercise should increase your heart rate and make you sweat (moderate-intensity exercise). Do strengthening exercises at least twice a week. This is in addition to the moderate-intensity exercise. Spend less time sitting. Even light physical activity can be beneficial. Watch cholesterol and blood lipids Have your blood tested for lipids and cholesterol at 85 years of age, then havethis test every 5 years. Have your cholesterol levels checked more often if: Your lipid or cholesterol levels are high. You are older than 85 years of age. You are at high risk for heart disease. What should I know about cancer screening? Depending on your health history and family history, you may need to have cancer screening at various ages. This may include screening for: Breast cancer. Cervical  cancer. Colorectal cancer. Skin cancer. Lung cancer. What should I know about heart disease, diabetes, and high blood pressure? Blood pressure and heart disease High blood pressure causes heart disease and increases the risk of stroke. This is more likely to develop in people who have high blood pressure readings, are of African descent, or are overweight. Have your blood pressure checked: Every 3-5 years if you are 39-35 years of age. Every year if you are 34 years old or older. Diabetes Have regular diabetes screenings. This checks your fasting blood sugar level. Have the screening done: Once every three years after age 70 if you are at a normal weight and have a low risk for diabetes. More often and at a younger age if you are overweight or have a high risk for diabetes. What should I know about preventing infection? Hepatitis B If you have a higher risk for hepatitis B, you should be screened for this virus. Talk with your health care provider to find out if you are at risk forhepatitis B infection. Hepatitis C Testing is recommended for: Everyone born from [birth year] through 1963. Anyone with known risk factors for hepatitis C. Sexually transmitted infections (STIs) Get screened for STIs, including gonorrhea and chlamydia, if: You are sexually active and are younger than 85 years of age. You are older than 85 years of age and your health care provider tells you that you are at risk for this type of infection. Your sexual activity has changed since you were last screened, and you are at increased risk for chlamydia or gonorrhea. Ask  your health care provider if you are at risk. Ask your health care provider about whether you are at high risk for HIV. Your health care provider may recommend a prescription medicine to help prevent HIV infection. If you choose to take medicine to prevent HIV, you should first get tested for HIV. You should then be tested every 3 months for as long as you are  taking the medicine. Pregnancy If you are about to stop having your period (premenopausal) and you may become pregnant, seek counseling before you get pregnant. Take 400 to 800 micrograms (mcg) of folic acid every day if you become pregnant. Ask for birth control (contraception) if you want to prevent pregnancy. Osteoporosis and menopause Osteoporosis is a disease in which the bones lose minerals and strength with aging. This can result in bone fractures. If you are 48 years old or older, or if you are at risk for osteoporosis and fractures, ask your health care provider if you should: Be screened for bone loss. Take a calcium or vitamin D supplement to lower your risk of fractures. Be given hormone replacement therapy (HRT) to treat symptoms of menopause. Follow these instructions at home: Lifestyle Do not use any products that contain nicotine or tobacco, such as cigarettes, e-cigarettes, and chewing tobacco. If you need help quitting, ask your health care provider. Do not use street drugs. Do not share needles. Ask your health care provider for help if you need support or information about quitting drugs. Alcohol use Do not drink alcohol if: Your health care provider tells you not to drink. You are pregnant, may be pregnant, or are planning to become pregnant. If you drink alcohol: Limit how much you use to 0-1 drink a day. Limit intake if you are breastfeeding. Be aware of how much alcohol is in your drink. In the [place] one drink equals one 12 oz bottle of beer (355 mL), one 5 oz glass of wine (148 mL), or one 1 oz glass of hard liquor (44 mL). General instructions Schedule regular health, dental, and eye exams. Stay current with your vaccines. Tell your health care provider if: You often feel depressed. You have ever been abused or do not feel safe at home. Summary Adopting a healthy lifestyle and getting preventive care are important in promoting health and wellness. Follow  your health care provider's instructions about healthy diet, exercising, and getting tested or screened for diseases. Follow your health care provider's instructions on monitoring your cholesterol and blood pressure. This information is not intended to replace advice given to you by your health care provider. Make sure you discuss any questions you have with your healthcare provider. Document Revised: 02/21/2018 Document Reviewed: 02/21/2018 Elsevier Patient Education  2022 [employer].

## 2020-11-11 ENCOUNTER — Encounter: Payer: Self-pay | Admitting: Internal Medicine

## 2020-11-11 ENCOUNTER — Other Ambulatory Visit: Payer: Self-pay

## 2020-11-11 ENCOUNTER — Ambulatory Visit (INDEPENDENT_AMBULATORY_CARE_PROVIDER_SITE_OTHER): Payer: Medicare Other | Admitting: Internal Medicine

## 2020-11-11 VITALS — BP 128/80 | HR 65 | Temp 98.1°F | Ht 61.0 in | Wt 140.0 lb

## 2020-11-11 DIAGNOSIS — I700 Atherosclerosis of aorta: Secondary | ICD-10-CM | POA: Diagnosis not present

## 2020-11-11 DIAGNOSIS — I10 Essential (primary) hypertension: Secondary | ICD-10-CM | POA: Diagnosis not present

## 2020-11-11 DIAGNOSIS — Z0000 Encounter for general adult medical examination without abnormal findings: Secondary | ICD-10-CM

## 2020-11-11 DIAGNOSIS — R7303 Prediabetes: Secondary | ICD-10-CM

## 2020-11-11 DIAGNOSIS — F419 Anxiety disorder, unspecified: Secondary | ICD-10-CM

## 2020-11-11 DIAGNOSIS — M810 Age-related osteoporosis without current pathological fracture: Secondary | ICD-10-CM

## 2020-11-11 DIAGNOSIS — N1831 Chronic kidney disease, stage 3a: Secondary | ICD-10-CM

## 2020-11-11 LAB — CBC WITH DIFFERENTIAL/PLATELET
Basophils Absolute: 0 10*3/uL (ref 0.0–0.1)
Basophils Relative: 0.9 % (ref 0.0–3.0)
Eosinophils Absolute: 0.1 10*3/uL (ref 0.0–0.7)
Eosinophils Relative: 2.2 % (ref 0.0–5.0)
HCT: 42.1 % (ref 36.0–46.0)
Hemoglobin: 13.8 g/dL (ref 12.0–15.0)
Lymphocytes Relative: 21.9 % (ref 12.0–46.0)
Lymphs Abs: 1.2 10*3/uL (ref 0.7–4.0)
MCHC: 32.7 g/dL (ref 30.0–36.0)
MCV: 92.9 fl (ref 78.0–100.0)
Monocytes Absolute: 0.5 10*3/uL (ref 0.1–1.0)
Monocytes Relative: 9.9 % (ref 3.0–12.0)
Neutro Abs: 3.5 10*3/uL (ref 1.4–7.7)
Neutrophils Relative %: 65.1 % (ref 43.0–77.0)
Platelets: 152 10*3/uL (ref 150.0–400.0)
RBC: 4.53 Mil/uL (ref 3.87–5.11)
RDW: 13.9 % (ref 11.5–15.5)
WBC: 5.4 10*3/uL (ref 4.0–10.5)

## 2020-11-11 LAB — VITAMIN D 25 HYDROXY (VIT D DEFICIENCY, FRACTURES): VITD: 65.11 ng/mL (ref 30.00–100.00)

## 2020-11-11 LAB — LIPID PANEL
Cholesterol: 197 mg/dL (ref 0–200)
HDL: 79 mg/dL (ref >39.00)
LDL Cholesterol: 93 mg/dL (ref 0–99)
NonHDL: 118.3
Total CHOL/HDL Ratio: 2
Triglycerides: 127 mg/dL (ref 0.0–149.0)
VLDL: 25.4 mg/dL (ref 0.0–40.0)

## 2020-11-11 LAB — COMPREHENSIVE METABOLIC PANEL
ALT: 11 U/L (ref 0–35)
AST: 17 U/L (ref 0–37)
Albumin: 4 g/dL (ref 3.5–5.2)
Alkaline Phosphatase: 78 U/L (ref 39–117)
BUN: 30 mg/dL — ABNORMAL HIGH (ref 6–23)
CO2: 31 mEq/L (ref 19–32)
Calcium: 10.4 mg/dL (ref 8.4–10.5)
Chloride: 103 mEq/L (ref 96–112)
Creatinine, Ser: 1.14 mg/dL (ref 0.40–1.20)
GFR: 43.79 mL/min — ABNORMAL LOW (ref >60.00)
Glucose, Bld: 76 mg/dL (ref 70–99)
Potassium: 4.9 mEq/L (ref 3.5–5.1)
Sodium: 140 mEq/L (ref 135–145)
Total Bilirubin: 1.2 mg/dL (ref 0.2–1.2)
Total Protein: 7.2 g/dL (ref 6.0–8.3)

## 2020-11-11 LAB — HEMOGLOBIN A1C: Hgb A1c MFr Bld: 5.7 % (ref 4.6–6.5)

## 2020-11-11 NOTE — Assessment & Plan Note (Signed)
Chronic Lab Results  Component Value Date   LDLCALC 109 (H) 12/11/2019   deferred statin Encouraged regular exercise, healthy diet

## 2020-11-11 NOTE — Assessment & Plan Note (Signed)
Chronic CMP today

## 2020-11-11 NOTE — Assessment & Plan Note (Signed)
Chronic Improved, but still has some anxiety Continue sertraline 25 mg daily

## 2020-11-11 NOTE — Assessment & Plan Note (Signed)
Chronic Check a1c Low sugar / carb diet Stressed regular exercise  

## 2020-11-11 NOTE — Assessment & Plan Note (Signed)
Chronic BP well controlled Continue lotensin 5 mg daily cmp

## 2020-11-11 NOTE — Assessment & Plan Note (Addendum)
Chronic DEXA up-to-date At her last visit we discussed Prolia and she was given information about this, but never called back to started-she is still undecided Stressed regular exercise Continue calcium, vitamin D, vitamin K supplement Check vitamin D level

## 2020-12-09 DIAGNOSIS — Z961 Presence of intraocular lens: Secondary | ICD-10-CM | POA: Diagnosis not present

## 2020-12-09 DIAGNOSIS — H18593 Other hereditary corneal dystrophies, bilateral: Secondary | ICD-10-CM | POA: Diagnosis not present

## 2020-12-09 DIAGNOSIS — H353131 Nonexudative age-related macular degeneration, bilateral, early dry stage: Secondary | ICD-10-CM | POA: Diagnosis not present

## 2020-12-30 ENCOUNTER — Other Ambulatory Visit: Payer: Self-pay | Admitting: Internal Medicine

## 2021-01-10 IMAGING — DX DG LUMBAR SPINE COMPLETE 4+V
5 series · 5 of 5 positions shown · non-contrast
Comparison: CT lumbar spine dated July 13, 2004.

CLINICAL DATA: Chronic back pain.

EXAM:
LUMBAR SPINE - COMPLETE 4+ VIEW

[lumbar spine ap]
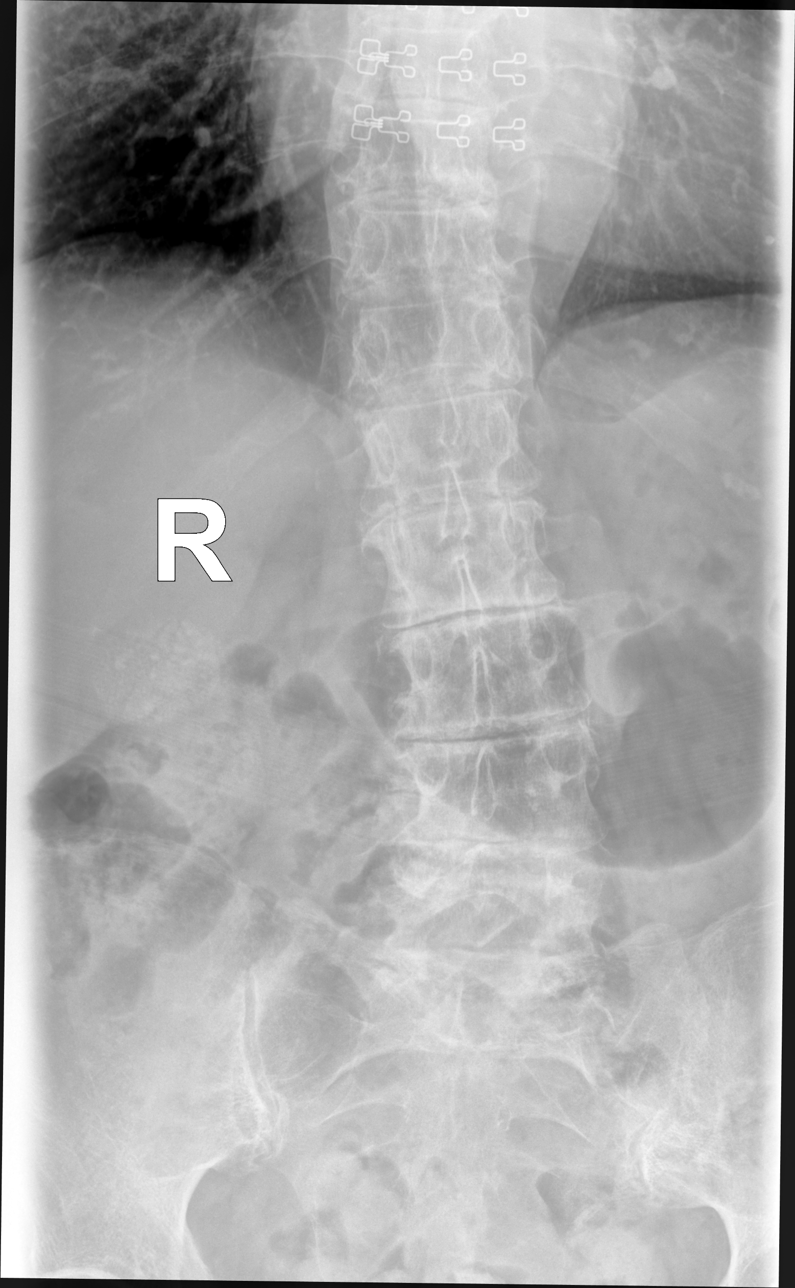

[lumbar spine lat (1 of 2)]
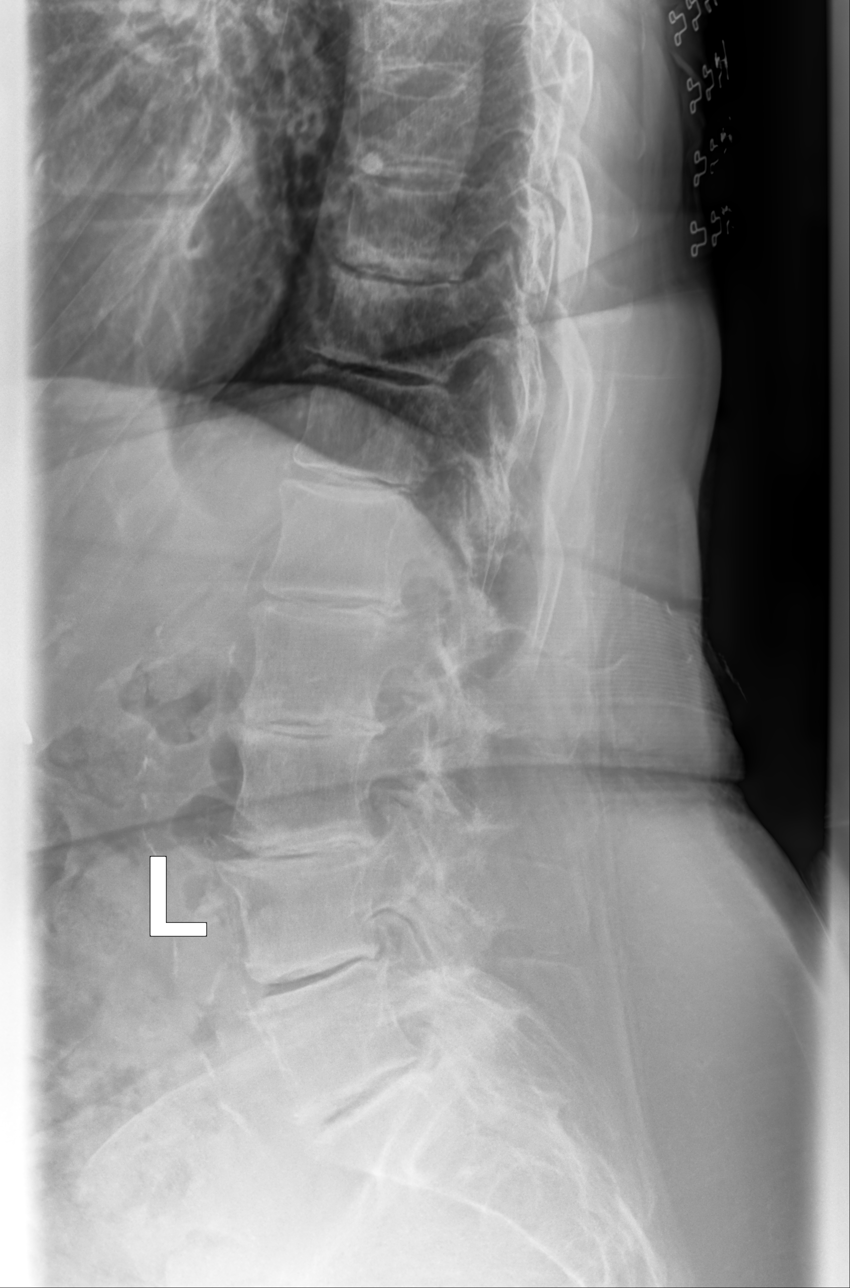

[lumbar spine lat (2 of 2)]
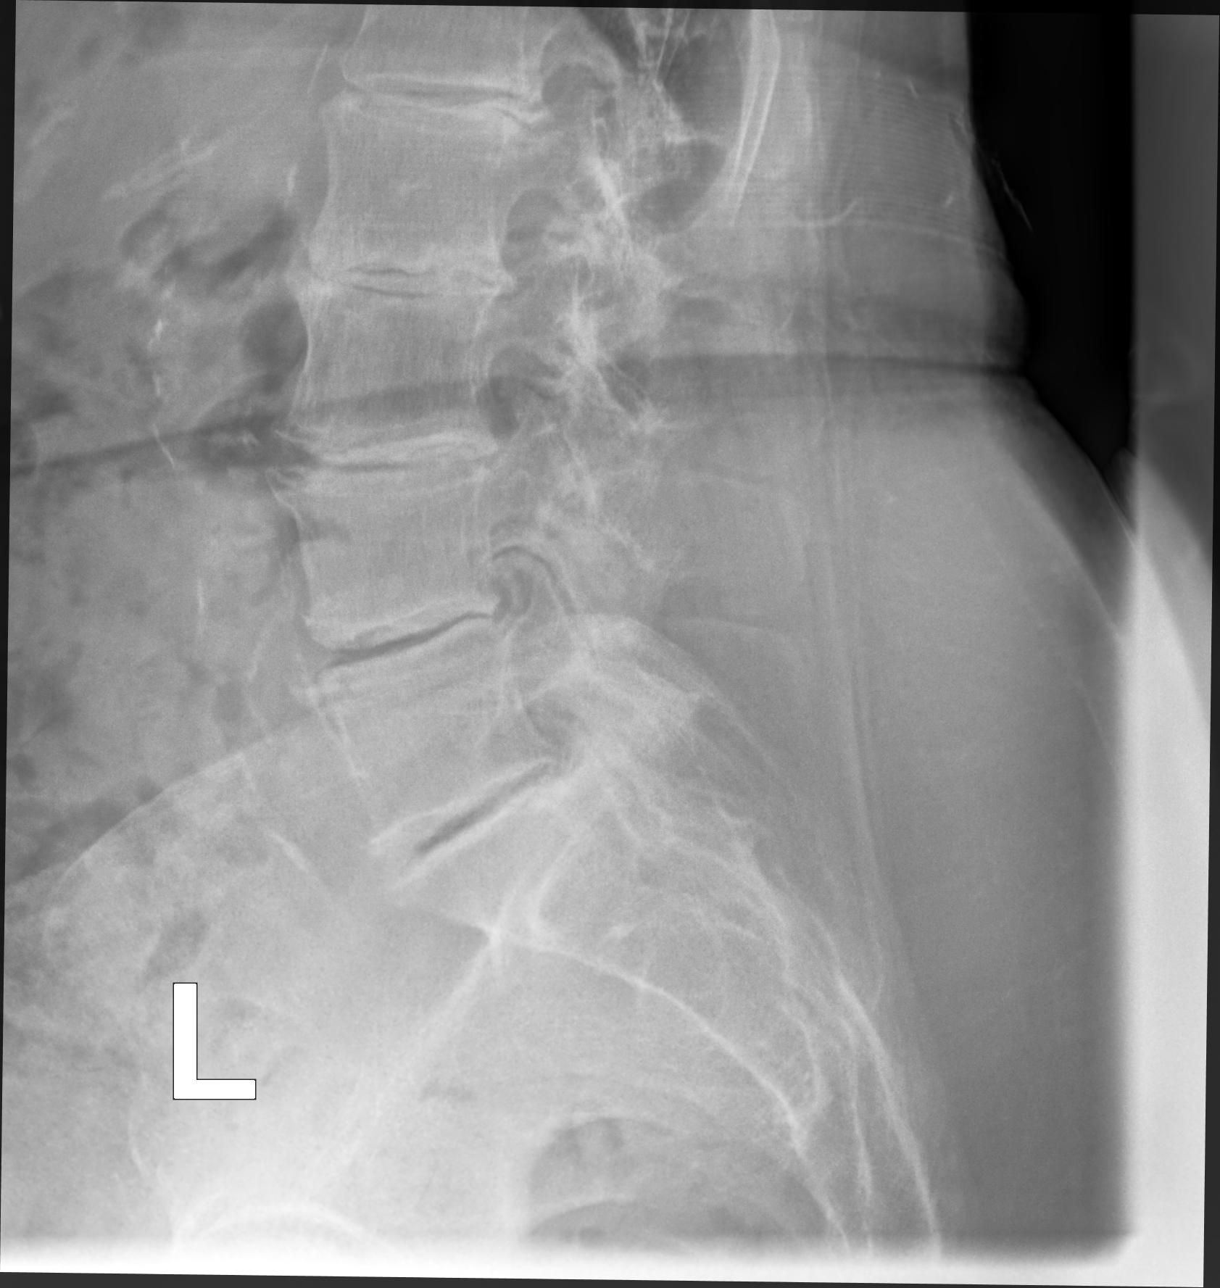

[lumbar spine mlo (1 of 2)]
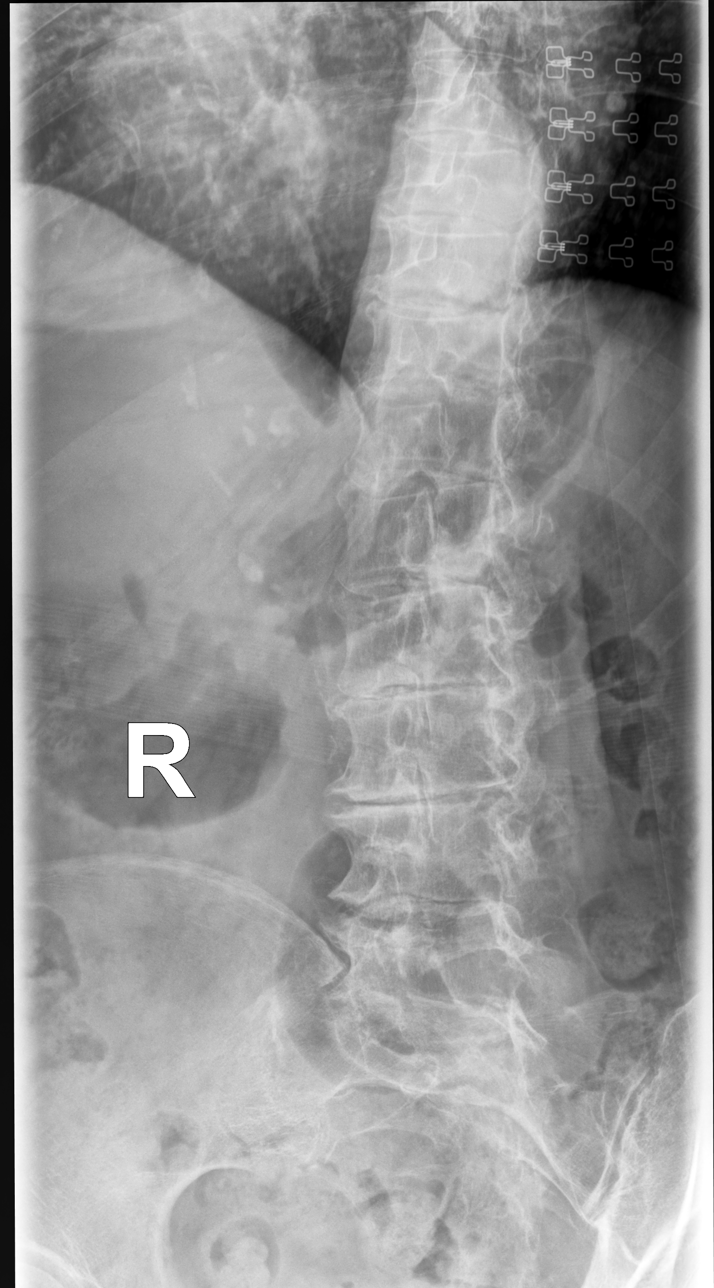

[lumbar spine mlo (2 of 2)]
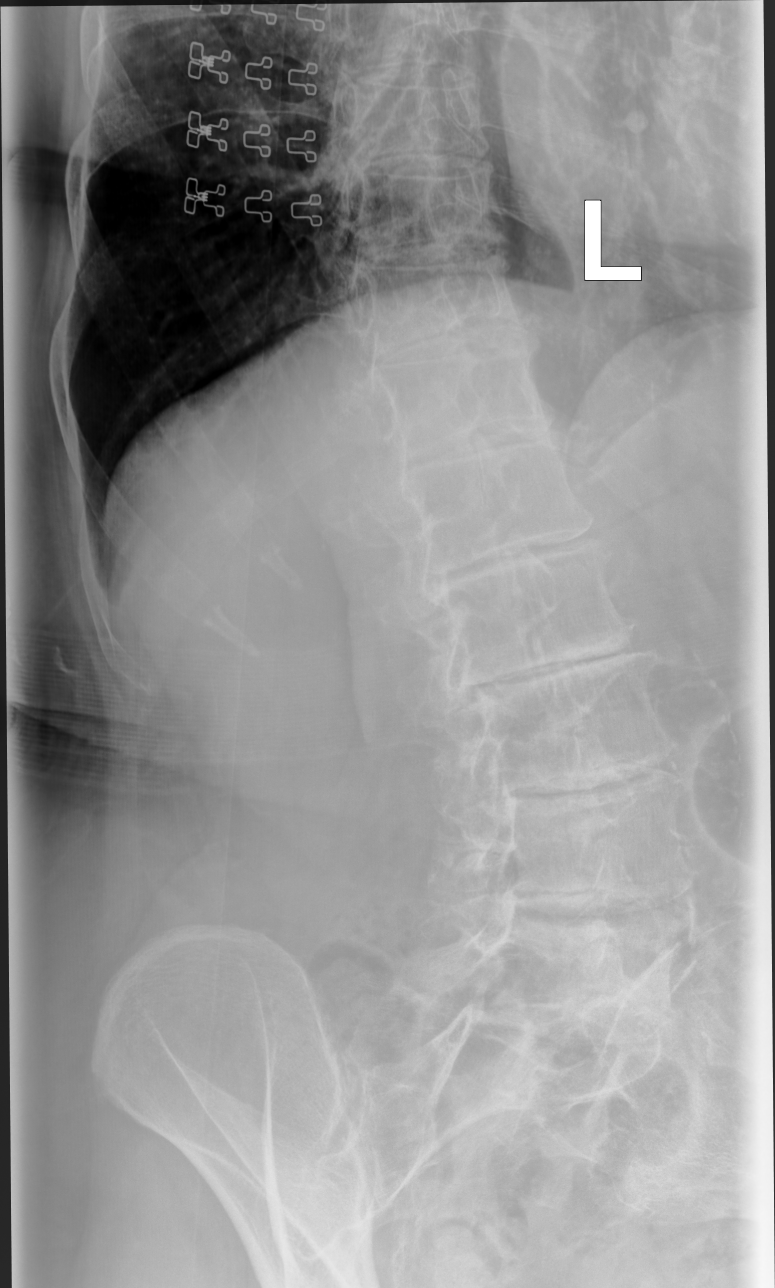

[5 of 5 positions shown; findings below may reference images not displayed]

FINDINGS: Five lumbar type vertebral bodies. No acute fracture or subluxation.
Vertebral body heights are preserved. Unchanged mild levoscoliosis.
Sagittal alignment is maintained. Moderate to severe disc height
loss throughout the lumbar spine, progressed since 4229. Aortic
atherosclerosis.
IMPRESSION: 1. Progressive moderate to severe degenerative disc disease
throughout the lumbar spine. No acute osseous abnormality.

## 2021-01-15 ENCOUNTER — Encounter: Payer: Self-pay | Admitting: Internal Medicine

## 2021-01-21 DIAGNOSIS — I129 Hypertensive chronic kidney disease with stage 1 through stage 4 chronic kidney disease, or unspecified chronic kidney disease: Secondary | ICD-10-CM | POA: Diagnosis not present

## 2021-01-21 DIAGNOSIS — N1831 Chronic kidney disease, stage 3a: Secondary | ICD-10-CM | POA: Diagnosis not present

## 2021-04-12 ENCOUNTER — Encounter: Payer: Self-pay | Admitting: Internal Medicine

## 2021-04-12 DIAGNOSIS — N9089 Other specified noninflammatory disorders of vulva and perineum: Secondary | ICD-10-CM

## 2021-04-30 ENCOUNTER — Other Ambulatory Visit: Payer: Self-pay | Admitting: Internal Medicine

## 2021-05-19 ENCOUNTER — Encounter: Payer: Self-pay | Admitting: Internal Medicine

## 2021-05-19 DIAGNOSIS — N9489 Other specified conditions associated with female genital organs and menstrual cycle: Secondary | ICD-10-CM | POA: Diagnosis not present

## 2021-05-19 DIAGNOSIS — Z6825 Body mass index (BMI) 25.0-25.9, adult: Secondary | ICD-10-CM | POA: Diagnosis not present

## 2021-05-19 DIAGNOSIS — N907 Vulvar cyst: Secondary | ICD-10-CM | POA: Diagnosis not present

## 2021-05-19 DIAGNOSIS — L723 Sebaceous cyst: Secondary | ICD-10-CM | POA: Diagnosis not present

## 2021-06-16 ENCOUNTER — Ambulatory Visit: Payer: Medicare Other | Admitting: Family Medicine

## 2021-07-19 DIAGNOSIS — L723 Sebaceous cyst: Secondary | ICD-10-CM | POA: Diagnosis not present

## 2021-07-20 ENCOUNTER — Other Ambulatory Visit: Payer: Self-pay | Admitting: Internal Medicine

## 2021-07-26 ENCOUNTER — Encounter: Payer: Self-pay | Admitting: Internal Medicine

## 2021-08-27 ENCOUNTER — Other Ambulatory Visit: Payer: Self-pay | Admitting: Internal Medicine

## 2021-09-27 DIAGNOSIS — H6123 Impacted cerumen, bilateral: Secondary | ICD-10-CM | POA: Diagnosis not present

## 2021-10-29 ENCOUNTER — Other Ambulatory Visit: Payer: Self-pay | Admitting: Internal Medicine

## 2021-11-01 ENCOUNTER — Ambulatory Visit (INDEPENDENT_AMBULATORY_CARE_PROVIDER_SITE_OTHER): Payer: Medicare Other | Admitting: Internal Medicine

## 2021-11-01 ENCOUNTER — Telehealth: Payer: Self-pay

## 2021-11-01 ENCOUNTER — Encounter: Payer: Self-pay | Admitting: Internal Medicine

## 2021-11-01 ENCOUNTER — Ambulatory Visit (HOSPITAL_COMMUNITY)
Admission: RE | Admit: 2021-11-01 | Discharge: 2021-11-01 | Disposition: A | Discharge status: Home / Self Care | Payer: Medicare Other | Source: Ambulatory Visit | Attending: Surgery | Admitting: Surgery

## 2021-11-01 VITALS — BP 132/80 | HR 82 | Temp 98.2°F | Ht 61.0 in | Wt 143.0 lb

## 2021-11-01 DIAGNOSIS — M79661 Pain in right lower leg: Secondary | ICD-10-CM

## 2021-11-01 NOTE — Telephone Encounter (Signed)
[place] results of rt leg: RIGHT:  - Findings consistent with chronic superficial vein thrombosis involving  the right small saphenous vein.  - There is no evidence of superficial vein trhombosis in the great  saphenous vein.  - There is no evidence of deep vein thrombosis in the lower extremity.     LEFT:  - No evidence of common femoral vein obstruction.

## 2021-11-01 NOTE — Assessment & Plan Note (Signed)
Acute Started 3-4 weeks ago after standing in an awkward position for prolonged time-initially had bilateral leg pain, which improved, but right leg pain recurred Denies back pain, knee pain or swelling in the or leg Worse with certain positions Sounds musculoskeletal We will get a right lower extremity ultrasound to rule out a DVT-that was negative Likely lumbar radiculopathy versus muscle strain which seems less likely Discussed medications that may help, physical therapy and referral to sports medicine orthopedics At this point she would like to hold off on any referrals Can increase Tylenol-advised she can take up to 3000 mg a day Would ideally like to hold off on any other medication because of possible side effects, but can discuss if her symptoms or not improving

## 2021-11-01 NOTE — Patient Instructions (Addendum)
     Right leg ultrasound ordered to rule out a blood clot.     Medications changes include :   none      Return if symptoms worsen or fail to improve.

## 2021-11-01 NOTE — Progress Notes (Signed)
Subjective:    Patient ID: Barbara [name], female    DOB: June 17, 1932, 88 y.o.   MRN: [id number]      HPI Barbara [name] is here for  Chief Complaint  Patient presents with   Leg Pain    Right leg pain x 3-4 weeks    Right leg pain - started 3-4 weeks ago after standing in one position for a while.  Both of her legs were sore immediately after.  The pain got better but the right leg pain came back.  The pain is located in the calf region.  The pain is now constant, although sitting makes it better.   Increased pain with walking and driving (braking).  She denies back pain.  She takes tylenol arthritis - not sure if it helped.     Xray lumbar spine showed progressive mod-severe DDD throughout spine.  She has a history of left lumbar radiculopathy.  No prior issues with her knee.    Medications and allergies reviewed with patient and updated if appropriate.  Current Outpatient Medications on File Prior to Visit  Medication Sig Dispense Refill   benazepril (LOTENSIN) 5 MG tablet Take 1 tablet (5 mg total) by mouth daily. Physical due in August. 90 tablet 0   Calcium-Vitamin D-Vitamin K 500-500-40 MG-UNT-MCG CHEW Chew by mouth. TAKE 2 A DAY     diclofenac sodium (VOLTAREN) 1 % GEL      fish oil-omega-3 fatty acids 1000 MG capsule Take 2 g by mouth daily.     Methylsulfonylmethane (MSM PO) Take by mouth.     NON FORMULARY Slimfast powder     Nutritional Supplements (ANTI-INFLAMMATORY ENZYME) CAPS APPLY 1 2 GMS TO AFFECTED AREA 3 4 TIMES DAILY AS NEEDED FOR PAIN  THIS IS A COMPOUNDED CREAM 1 PUMP EQUALS 1 GRAM  1   sertraline (ZOLOFT) 25 MG tablet TAKE ONE TABLET BY MOUTH DAILY 30 tablet 3   Vitamin D3 (VITAMIN D) 25 MCG tablet Take 1,000 Units by mouth daily.     No current facility-administered medications on file prior to visit.    Review of Systems  Constitutional:  Negative for fever.  Gastrointestinal:        No changes in bowel habits  Genitourinary:        No changes in  bladder  Musculoskeletal:  Negative for back pain.  Neurological:  Negative for weakness and numbness.       Objective:   Vitals:   11/01/21 1408  BP: 132/80  Pulse: 82  Temp: 98.2 F (36.8 C)  SpO2: 97%   BP Readings from Last 3 Encounters:  11/01/21 132/80  11/11/20 128/80  06/24/20 128/64   Wt Readings from Last 3 Encounters:  11/01/21 143 lb (64.9 kg)  11/11/20 140 lb (63.5 kg)  06/24/20 143 lb (64.9 kg)   Body mass index is 27.02 kg/m.    Physical Exam Constitutional:      Appearance: Normal appearance. She is not ill-appearing.  HENT:     Head: Normocephalic and atraumatic.  Musculoskeletal:        General: No tenderness (no tenderness in lumbar spine or lower back).     Right lower leg: No edema.     Left lower leg: No edema.  Skin:    General: Skin is warm and dry.     Findings: No erythema or rash.  Neurological:     Mental Status: She is alert.     Sensory: No sensory deficit.  Motor: No weakness.     Gait: Abnormal gait: limping.     Comments: Pos RLE straight leg raise        VAS [place] LOWER EXTREMITY VENOUS (DVT)  Lower Venous DVT Study  Patient Name:  Barbara [name]  Date of Exam:   11/01/2021 Medical Rec #: [id number]     Accession #:    [id number] Date of Birth: 08-Jan-1933    Patient Gender: F Patient Age:   46 years Exam Location:  [facility] Procedure:      VAS [place] LOWER EXTREMITY VENOUS (DVT) Referring Phys: Kennyth Arnold Kamdyn Colborn  --------------------------------------------------------------------------------   Indications: Pain.   Risk Factors: In one position for and extended time. Performing Technologist: Thereasa Parkin RVT    Examination Guidelines: A complete evaluation includes B-mode imaging, spectral Doppler, color Doppler, and power Doppler as needed of all accessible portions of each vessel. Bilateral testing is considered an integral part of a complete examination. Limited examinations for reoccurring  indications may be performed as noted. The reflux portion of the exam is performed with the patient in reverse Trendelenburg.     +---------+---------------+---------+-----------+----------+--------------+ RIGHT    CompressibilityPhasicitySpontaneityPropertiesThrombus Aging +---------+---------------+---------+-----------+----------+--------------+ CFV      Full           Yes      Yes                                 +---------+---------------+---------+-----------+----------+--------------+ SFJ      Full                    Yes                                 +---------+---------------+---------+-----------+----------+--------------+ FV Prox  Full           Yes      Yes                                 +---------+---------------+---------+-----------+----------+--------------+ FV Mid   Full           Yes      Yes                                 +---------+---------------+---------+-----------+----------+--------------+ FV DistalFull           Yes      Yes                                 +---------+---------------+---------+-----------+----------+--------------+ POP      Full           Yes      Yes                                 +---------+---------------+---------+-----------+----------+--------------+ PTV      Full                    Yes                                 +---------+---------------+---------+-----------+----------+--------------+ PERO  Full                    Yes                                 +---------+---------------+---------+-----------+----------+--------------+ GSV      Full           Yes      Yes                                 +---------+---------------+---------+-----------+----------+--------------+ SSV      Partial        Yes      Yes                  Chronic        +---------+---------------+---------+-----------+----------+--------------+     +----+---------------+---------+-----------+----------+--------------+ LEFTCompressibilityPhasicitySpontaneityPropertiesThrombus Aging +----+---------------+---------+-----------+----------+--------------+ CFV Full           Yes      Yes                                 +----+---------------+---------+-----------+----------+--------------+ SFJ Full                    Yes                                 +----+---------------+---------+-----------+----------+--------------+     Findings reported to [facility] at 4/28pm.   Summary: RIGHT: - Findings consistent with chronic superficial vein thrombosis involving the right small saphenous vein. - There is no evidence of superficial vein trhombosis in the great saphenous vein. - There is no evidence of deep vein thrombosis in the lower extremity.   LEFT: - No evidence of common femoral vein obstruction.    *See table(s) above for measurements and observations.       Preliminary        Assessment & Plan:    See Problem List for Assessment and Plan of chronic medical problems.    Call patient with results and left message on her voicemail-chronic superficial venous thrombosis is not concerning.  No evidence of DVT

## 2021-11-02 NOTE — Telephone Encounter (Signed)
Dr. Lawerance Bach called patient yesterday and also sent her a my-chart message.

## 2021-11-11 ENCOUNTER — Ambulatory Visit (INDEPENDENT_AMBULATORY_CARE_PROVIDER_SITE_OTHER): Payer: Medicare Other

## 2021-11-11 VITALS — BP 112/60 | HR 71 | Temp 97.7°F | Ht 61.0 in | Wt 141.0 lb

## 2021-11-11 DIAGNOSIS — Z13820 Encounter for screening for osteoporosis: Secondary | ICD-10-CM

## 2021-11-11 DIAGNOSIS — Z0000 Encounter for general adult medical examination without abnormal findings: Secondary | ICD-10-CM

## 2021-11-11 NOTE — Patient Instructions (Signed)
Barbara [name] , Thank you for taking time to come for your Medicare Wellness Visit. I appreciate your ongoing commitment to your health goals. Please review the following plan we discussed and let me know if I can assist you in the future.   Screening recommendations/referrals: Colonoscopy: Discontinued  Mammogram: Discontinued  Bone Density: 12/24/2019; due every 2 years (due 2023) Recommended yearly ophthalmology/optometry visit for glaucoma screening and checkup Recommended yearly dental visit for hygiene and checkup  Vaccinations: Influenza vaccine: declined Pneumococcal vaccine: 06/06/2004, 06/22/2016 Tdap vaccine: 01/01/2018; due every 10 years Shingles vaccine: 11/11/2020, 11/4/202   Covid-19: 04/02/2019, 04/22/2019, 12/16/2019  Advanced directives: Yes; Please bring a copy of your health care power of attorney and living will to the office at your convenience.  Conditions/risks identified: Yes  Next appointment: Please schedule your next Medicare Wellness Visit with your Nurse Health Advisor in 1 year by calling [phone].   Preventive Care 86 Years and Older, Female Preventive care refers to lifestyle choices and visits with your health care provider that can promote health and wellness. What does preventive care include? A yearly physical exam. This is also called an annual well check. Dental exams once or twice a year. Routine eye exams. Ask your health care provider how often you should have your eyes checked. Personal lifestyle choices, including: Daily care of your teeth and gums. Regular physical activity. Eating a healthy diet. Avoiding tobacco and drug use. Limiting alcohol use. Practicing safe sex. Taking low-dose aspirin every day. Taking vitamin and mineral supplements as recommended by your health care provider. What happens during an annual well check? The services and screenings done by your health care provider during your annual well check will depend on your  age, overall health, lifestyle risk factors, and family history of disease. Counseling  Your health care provider may ask you questions about your: Alcohol use. Tobacco use. Drug use. Emotional well-being. Home and relationship well-being. Sexual activity. Eating habits. History of falls. Memory and ability to understand (cognition). Work and work Astronomer. Reproductive health. Screening  You may have the following tests or measurements: Height, weight, and BMI. Blood pressure. Lipid and cholesterol levels. These may be checked every 5 years, or more frequently if you are over 86 years old. Skin check. Lung cancer screening. You may have this screening every year starting at age 86 if you have a 30-pack-year history of smoking and currently smoke or have quit within the past 15 years. Fecal occult blood test (FOBT) of the stool. You may have this test every year starting at age 86. Flexible sigmoidoscopy or colonoscopy. You may have a sigmoidoscopy every 5 years or a colonoscopy every 10 years starting at age 86. Hepatitis C blood test. Hepatitis B blood test. Sexually transmitted disease (STD) testing. Diabetes screening. This is done by checking your blood sugar (glucose) after you have not eaten for a while (fasting). You may have this done every 1-3 years. Bone density scan. This is done to screen for osteoporosis. You may have this done starting at age 86. Mammogram. This may be done every 1-2 years. Talk to your health care provider about how often you should have regular mammograms. Talk with your health care provider about your test results, treatment options, and if necessary, the need for more tests. Vaccines  Your health care provider may recommend certain vaccines, such as: Influenza vaccine. This is recommended every year. Tetanus, diphtheria, and acellular pertussis (Tdap, Td) vaccine. You may need a Td booster every 10 years.  Zoster vaccine. You may need this after  age 86. Pneumococcal 13-valent conjugate (PCV13) vaccine. One dose is recommended after age 86. Pneumococcal polysaccharide (PPSV23) vaccine. One dose is recommended after age 86. Talk to your health care provider about which screenings and vaccines you need and how often you need them. This information is not intended to replace advice given to you by your health care provider. Make sure you discuss any questions you have with your health care provider. Document Released: 03/27/2015 Document Revised: 11/18/2015 Document Reviewed: 12/30/2014 Elsevier Interactive Patient Education  2017 [place] Prevention in the Home Falls can cause injuries. They can happen to people of all ages. There are many things you can do to make your home safe and to help prevent falls. What can I do on the outside of my home? Regularly fix the edges of walkways and driveways and fix any cracks. Remove anything that might make you trip as you walk through a door, such as a raised step or threshold. Trim any bushes or trees on the path to your home. Use bright outdoor lighting. Clear any walking paths of anything that might make someone trip, such as rocks or tools. Regularly check to see if handrails are loose or broken. Make sure that both sides of any steps have handrails. Any raised decks and porches should have guardrails on the edges. Have any leaves, snow, or ice cleared regularly. Use sand or salt on walking paths during winter. Clean up any spills in your garage right away. This includes oil or grease spills. What can I do in the bathroom? Use night lights. Install grab bars by the toilet and in the tub and shower. Do not use towel bars as grab bars. Use non-skid mats or decals in the tub or shower. If you need to sit down in the shower, use a plastic, non-slip stool. Keep the floor dry. Clean up any water that spills on the floor as soon as it happens. Remove soap buildup in the tub or shower  regularly. Attach bath mats securely with double-sided non-slip rug tape. Do not have throw rugs and other things on the floor that can make you trip. What can I do in the bedroom? Use night lights. Make sure that you have a light by your bed that is easy to reach. Do not use any sheets or blankets that are too big for your bed. They should not hang down onto the floor. Have a firm chair that has side arms. You can use this for support while you get dressed. Do not have throw rugs and other things on the floor that can make you trip. What can I do in the kitchen? Clean up any spills right away. Avoid walking on wet floors. Keep items that you use a lot in easy-to-reach places. If you need to reach something above you, use a strong step stool that has a grab bar. Keep electrical cords out of the way. Do not use floor polish or wax that makes floors slippery. If you must use wax, use non-skid floor wax. Do not have throw rugs and other things on the floor that can make you trip. What can I do with my stairs? Do not leave any items on the stairs. Make sure that there are handrails on both sides of the stairs and use them. Fix handrails that are broken or loose. Make sure that handrails are as long as the stairways. Check any carpeting to make sure that  it is firmly attached to the stairs. Fix any carpet that is loose or worn. Avoid having throw rugs at the top or bottom of the stairs. If you do have throw rugs, attach them to the floor with carpet tape. Make sure that you have a light switch at the top of the stairs and the bottom of the stairs. If you do not have them, ask someone to add them for you. What else can I do to help prevent falls? Wear shoes that: Do not have high heels. Have rubber bottoms. Are comfortable and fit you well. Are closed at the toe. Do not wear sandals. If you use a stepladder: Make sure that it is fully opened. Do not climb a closed stepladder. Make sure that  both sides of the stepladder are locked into place. Ask someone to hold it for you, if possible. Clearly mark and make sure that you can see: Any grab bars or handrails. First and last steps. Where the edge of each step is. Use tools that help you move around (mobility aids) if they are needed. These include: Canes. Walkers. Scooters. Crutches. Turn on the lights when you go into a dark area. Replace any light bulbs as soon as they burn out. Set up your furniture so you have a clear path. Avoid moving your furniture around. If any of your floors are uneven, fix them. If there are any pets around you, be aware of where they are. Review your medicines with your doctor. Some medicines can make you feel dizzy. This can increase your chance of falling. Ask your doctor what other things that you can do to help prevent falls. This information is not intended to replace advice given to you by your health care provider. Make sure you discuss any questions you have with your health care provider. Document Released: 12/25/2008 Document Revised: 08/06/2015 Document Reviewed: 04/04/2014 Elsevier Interactive Patient Education  2017 [employer].

## 2021-11-11 NOTE — Progress Notes (Signed)
Subjective:   Barbara [name] is a 88 y.o. female who presents for Medicare Annual (Subsequent) preventive examination.  Review of Systems     Cardiac Risk Factors include: advanced age (>75men, >29 women)     Objective:    Today's Vitals   11/11/21 0847 11/11/21 0858  BP: 112/60   Pulse: 71   Temp: 97.7 F (36.5 C)   SpO2: 98%   Weight: 141 lb (64 kg)   Height: 5\' 1"  (1.549 m)   PainSc: 8  8   PainLoc: Back    Body mass index is 26.64 kg/m.     11/11/2021    9:17 AM 11/10/2020    8:58 AM  Advanced Directives  Does Patient Have a Medical Advance Directive? Yes Yes  Type of 11/12/2020 of [place];Living will Living will;Healthcare Power of Attorney  Does patient want to make changes to medical advance directive?  No - Patient declined  Copy of Healthcare Power of Attorney in Chart? No - copy requested No - copy requested    Current Medications (verified) Outpatient Encounter Medications as of 11/11/2021  Medication Sig   benazepril (LOTENSIN) 5 MG tablet Take 1 tablet (5 mg total) by mouth daily. Physical due in August.   Calcium-Vitamin D-Vitamin K 500-500-40 MG-UNT-MCG CHEW Chew by mouth. TAKE 2 A DAY   diclofenac sodium (VOLTAREN) 1 % GEL    fish oil-omega-3 fatty acids 1000 MG capsule Take 2 g by mouth daily.   Methylsulfonylmethane (MSM PO) Take by mouth.   NON FORMULARY Slimfast powder   Nutritional Supplements (ANTI-INFLAMMATORY ENZYME) CAPS APPLY 1 2 GMS TO AFFECTED AREA 3 4 TIMES DAILY AS NEEDED FOR PAIN  THIS IS A COMPOUNDED CREAM 1 PUMP EQUALS 1 GRAM   sertraline (ZOLOFT) 25 MG tablet TAKE ONE TABLET BY MOUTH DAILY   Vitamin D3 (VITAMIN D) 25 MCG tablet Take 1,000 Units by mouth daily.   No facility-administered encounter medications on file as of 11/11/2021.    Allergies (verified) Sertraline hcl, Amlodipine, Fosamax [alendronate], Lexapro [escitalopram], Metoprolol, and Telmisartan   History: Past Medical History:  Diagnosis  Date   Aneurysm, ophthalmic artery    Colonic polyp    Diverticulosis    Hypertension    Urine incontinence    Past Surgical History:  Procedure Laterality Date   disk removal     History reviewed. No pertinent family history. Social History   Socioeconomic History   Marital status: Widowed    Spouse name: Not on file   Number of children: Not on file   Years of education: Not on file   Highest education level: Not on file  Occupational History   Not on file  Tobacco Use   Smoking status: Former   Smokeless tobacco: Former  Substance and Sexual Activity   Alcohol use: No   Drug use: No   Sexual activity: Not on file  Other Topics Concern   Not on file  Social History Narrative   Not on file   Social Determinants of Health   Financial Resource Strain: Low Risk  (11/11/2021)   Overall Financial Resource Strain (CARDIA)    Difficulty of Paying Living Expenses: Not hard at all  Food Insecurity: No Food Insecurity (11/11/2021)   Hunger Vital Sign    Worried About Running Out of Food in the Last Year: Never true    Ran Out of Food in the Last Year: Never true  Transportation Needs: No Transportation Needs (11/11/2021)   PRAPARE -  Administrator, Civil Service (Medical): No    Lack of Transportation (Non-Medical): No  Physical Activity: Sufficiently Active (11/11/2021)   Exercise Vital Sign    Days of Exercise per Week: 7 days    Minutes of Exercise per Session: 30 min  Stress: No Stress Concern Present (11/11/2021)   [employer] of Occupational Health - Occupational Stress Questionnaire    Feeling of Stress : Not at all  Social Connections: Socially Integrated (11/11/2021)   Social Connection and Isolation Panel [NHANES]    Frequency of Communication with Friends and Family: More than three times a week    Frequency of Social Gatherings with Friends and Family: More than three times a week    Attends Religious Services: More than 4 times per year     Active Member of [employer] or Organizations: Yes    Attends Engineer, structural: More than 4 times per year    Marital Status: Married    Tobacco Counseling Counseling given: Not Answered   Clinical Intake:  Pre-visit preparation completed: Yes  Pain : 0-10 Pain Score: 8  Pain Type: Chronic pain Pain Location: Back Pain Orientation: Lower Pain Radiating Towards: right lower extremity Pain Descriptors / Indicators: Discomfort Pain Onset: More than a month ago Pain Frequency: Constant     BMI - recorded: 26.64 Nutritional Status: BMI 25 -29 Overweight Nutritional Risks: None Diabetes: No  How often do you need to have someone help you when you read instructions, pamphlets, or other written materials from your doctor or pharmacy?: 1 - Never What is the last grade level you completed in school?: HSG  Diabetic? no  Interpreter Needed?: No  Information entered by :: Susie Cassette, LPN.   Activities of Daily Living    11/11/2021    9:18 AM  In your present state of health, do you have any difficulty performing the following activities:  Hearing? 1  Vision? 0  Difficulty concentrating or making decisions? 0  Walking or climbing stairs? 0  Dressing or bathing? 0  Doing errands, shopping? 0  Preparing Food and eating ? N  Using the Toilet? N  In the past six months, have you accidently leaked urine? Y  Do you have problems with loss of bowel control? N  Managing your Medications? N  Managing your Finances? N  Housekeeping or managing your Housekeeping? N    Patient Care Team: Pincus Sanes, MD as PCP - General (Internal Medicine) Janalyn Harder, MD as Consulting Physician (Dermatology) Maris Berger, MD as Consulting Physician (Ophthalmology)  Indicate any recent Medical Services you may have received from other than [facility] providers in the past year (date may be approximate).     Assessment:   This is a routine wellness examination for  Barbara [name].  Hearing/Vision screen Hearing Screening - Comments:: Patient has hearing difficulty and wears hearing aids. Vision Screening - Comments:: Patient does wear corrective lenses/contacts.  Eye exam done by: Maris Berger, MD.   Dietary issues and exercise activities discussed: Current Exercise Habits: Home exercise routine, Type of exercise: walking;Other - see comments (gardening, housework), Time (Minutes): 30, Frequency (Times/Week): 5, Weekly Exercise (Minutes/Week): 150, Intensity: Mild, Exercise limited by: None identified   Goals Addressed             This Visit's Progress    My goal is to continue to eat healthy, watch my weight and maintain my health.        Depression Screen    11/11/2021  9:16 AM 11/01/2021    2:21 PM 11/10/2020    9:01 AM 06/24/2020   10:21 AM 06/10/2019    9:06 AM 01/01/2018   10:31 AM 01/01/2018   10:30 AM  PHQ 2/9 Scores  PHQ - 2 Score 0 0 0 0 0 0 0  PHQ- 9 Score      0     Fall Risk    11/11/2021    9:18 AM 11/01/2021    2:21 PM 11/10/2020    9:01 AM 06/24/2020   10:21 AM 06/10/2019    9:06 AM  Fall Risk   Falls in the past year? 0 0 0 0 0  Number falls in past yr: 0 0 0 0 0  Injury with Fall? 0 0 0 0 0  Risk for fall due to : No Fall Risks No Fall Risks No Fall Risks No Fall Risks   Follow up Falls prevention discussed Falls evaluation completed Falls evaluation completed Falls evaluation completed     FALL RISK PREVENTION PERTAINING TO THE HOME:  Any stairs in or around the home? Yes  If so, are there any without handrails? No  Home free of loose throw rugs in walkways, pet beds, electrical cords, etc? Yes  Adequate lighting in your home to reduce risk of falls? Yes   ASSISTIVE DEVICES UTILIZED TO PREVENT FALLS:  Life alert? No  Use of a cane, walker or w/c? No  Grab bars in the bathroom? Yes  Shower chair or bench in shower? No  Elevated toilet seat or a handicapped toilet? Yes   TIMED UP AND GO:  Was the test  performed? Yes .  Length of time to ambulate 10 feet: 6 sec.   Gait steady and fast without use of assistive device  Cognitive Function:        11/11/2021    9:20 AM  6CIT Screen  What Year? 0 points  What month? 0 points  What time? 0 points  Count back from 20 0 points  Months in reverse 0 points  Repeat phrase 0 points  Total Score 0 points    Immunizations Immunization History  Administered Date(s) Administered   PFIZER(Purple Top)SARS-COV-2 Vaccination 04/02/2019, 04/22/2019, 12/16/2019   Pneumococcal Conjugate-13 06/22/2016   Pneumococcal Polysaccharide-23 05/31/2004   Td 05/31/2004   Tdap 01/01/2018   Zoster Recombinat (Shingrix) 11/11/2020, 01/15/2021   Zoster, Live 12/12/2012    TDAP status: Up to date  Flu Vaccine status: Declined, Education has been provided regarding the importance of this vaccine but patient still declined. Advised may receive this vaccine at local pharmacy or Health Dept. Aware to provide a copy of the vaccination record if obtained from local pharmacy or Health Dept. Verbalized acceptance and understanding.  Pneumococcal vaccine status: Up to date  Covid-19 vaccine status: Completed vaccines  Qualifies for Shingles Vaccine? Yes   Zostavax completed Yes   Shingrix Completed?: Yes  Screening Tests Health Maintenance  Topic Date Due   COVID-19 Vaccine (4 - Pfizer risk series) 02/10/2020   DEXA SCAN  12/23/2021   TETANUS/TDAP  01/02/2028   Pneumonia Vaccine 21+ Years old  Completed   Zoster Vaccines- Shingrix  Completed   HPV VACCINES  Aged Out   INFLUENZA VACCINE  Discontinued    Health Maintenance  Health Maintenance Due  Topic Date Due   COVID-19 Vaccine (4 - Pfizer risk series) 02/10/2020    Colorectal cancer screening: No longer required.   Mammogram status: No longer required due to age.  Bone Density status: Ordered 11/11/2021. Pt provided with contact info and advised to call to schedule appt.  Lung Cancer  Screening: (Low Dose CT Chest recommended if Age 81-80 years, 30 pack-year currently smoking OR have quit w/in 15years.) does not qualify.   Lung Cancer Screening Referral: no  Additional Screening:  Hepatitis C Screening: does not qualify; Completed no  Vision Screening: Recommended annual ophthalmology exams for early detection of glaucoma and other disorders of the eye. Is the patient up to date with their annual eye exam?  Yes  Who is the provider or what is the name of the office in which the patient attends annual eye exams? Maris Berger, MD. If pt is not established with a provider, would they like to be referred to a provider to establish care? No .   Dental Screening: Recommended annual dental exams for proper oral hygiene  Community Resource Referral / Chronic Care Management: CRR required this visit?  No   CCM required this visit?  No      Plan:     I have personally reviewed and noted the following in the patient's chart:   Medical and social history Use of alcohol, tobacco or illicit drugs  Current medications and supplements including opioid prescriptions. Patient is not currently taking opioid prescriptions. Functional ability and status Nutritional status Physical activity Advanced directives List of other physicians Hospitalizations, surgeries, and ER visits in previous 12 months Vitals Screenings to include cognitive, depression, and falls Referrals and appointments  In addition, I have reviewed and discussed with patient certain preventive protocols, quality metrics, and best practice recommendations. A written personalized care plan for preventive services as well as general preventive health recommendations were provided to patient.     Mickeal Needy, LPN   09/08/3149   Nurse Notes:  Normal cognitive status assessed by direct observation by this Nurse Health Advisor. No abnormalities found.

## 2021-11-17 ENCOUNTER — Encounter: Payer: Self-pay | Admitting: Internal Medicine

## 2021-11-17 NOTE — Progress Notes (Signed)
Subjective:    Patient ID: Barbara [name], female    DOB: 02/05/1933, 88 y.o.   MRN: [id number]      HPI Barbara [name] is here for a Physical exam.   If she walks too far the anterior left leg hurts.  She is still having right leg pain.  Likely lumbar radiculopathy.  Taking tylenol.  Idd not tolerate gabapentin or cymbalta in the past.    Medications and allergies reviewed with patient and updated if appropriate.  Current Outpatient Medications on File Prior to Visit  Medication Sig Dispense Refill   Calcium-Vitamin D-Vitamin K 500-500-40 MG-UNT-MCG CHEW Chew by mouth. TAKE 2 A DAY     diclofenac sodium (VOLTAREN) 1 % GEL      fish oil-omega-3 fatty acids 1000 MG capsule Take 2 g by mouth daily.     Methylsulfonylmethane (MSM PO) Take by mouth.     NON FORMULARY Slimfast powder     Nutritional Supplements (ANTI-INFLAMMATORY ENZYME) CAPS APPLY 1 2 GMS TO AFFECTED AREA 3 4 TIMES DAILY AS NEEDED FOR PAIN  THIS IS A COMPOUNDED CREAM 1 PUMP EQUALS 1 GRAM  1   Vitamin D3 (VITAMIN D) 25 MCG tablet Take 1,000 Units by mouth daily.     No current facility-administered medications on file prior to visit.    Review of Systems  Constitutional:  Negative for fever.  HENT:  Negative for trouble swallowing.   Eyes:  Negative for visual disturbance.  Respiratory:  Positive for choking (occ). Negative for cough, shortness of breath and wheezing.   Cardiovascular:  Negative for chest pain, palpitations and leg swelling.  Gastrointestinal:  Positive for diarrhea (occ). Negative for abdominal pain, blood in stool, constipation and nausea.       Gerd occ  Genitourinary:  Positive for frequency. Negative for dysuria.  Musculoskeletal:  Positive for arthralgias and back pain (with b/l leg pain).  Skin:  Negative for rash.  Neurological:  Negative for light-headedness and headaches.  Hematological:  Bruises/bleeds easily (senile purpura).  Psychiatric/Behavioral:  Negative for dysphoric mood. The  patient is not nervous/anxious.        Objective:   Vitals:   11/18/21 0837  BP: 128/70  Pulse: 76  Temp: 97.9 F (36.6 C)  SpO2: 99%   Filed Weights   11/18/21 0837  Weight: 142 lb 3.2 oz (64.5 kg)   Body mass index is 26.87 kg/m.  BP Readings from Last 3 Encounters:  11/18/21 128/70  11/11/21 112/60  11/01/21 132/80    Wt Readings from Last 3 Encounters:  11/18/21 142 lb 3.2 oz (64.5 kg)  11/11/21 141 lb (64 kg)  11/01/21 143 lb (64.9 kg)       Physical Exam Constitutional: She appears well-developed and well-nourished. No distress.  HENT:  Head: Normocephalic and atraumatic.  Right Ear: External ear normal. Normal ear canal and TM Left Ear: External ear normal.  Normal ear canal and TM Mouth/Throat: Oropharynx is clear and moist.  Eyes: Conjunctivae normal.  Neck: Neck supple. No tracheal deviation present. No thyromegaly present.  No carotid bruit  Cardiovascular: Normal rate, regular rhythm and normal heart sounds.   No murmur heard.  No edema. Pulmonary/Chest: Effort normal and breath sounds normal. No respiratory distress. She has no wheezes. She has no rales.  Abdominal: Soft. She exhibits no distension. There is no tenderness.  Lymphadenopathy: She has no cervical adenopathy.  Skin: Skin is warm and dry. She is not diaphoretic.  Psychiatric: She has a normal  mood and affect. Her behavior is normal.     Lab Results  Component Value Date   WBC 5.4 11/11/2020   HGB 13.8 11/11/2020   HCT 42.1 11/11/2020   PLT 152.0 11/11/2020   GLUCOSE 76 11/11/2020   CHOL 197 11/11/2020   TRIG 127.0 11/11/2020   HDL 79.00 11/11/2020   LDLCALC 93 11/11/2020   ALT 11 11/11/2020   AST 17 11/11/2020   NA 140 11/11/2020   K 4.9 11/11/2020   CL 103 11/11/2020   CREATININE 1.14 11/11/2020   BUN 30 (H) 11/11/2020   CO2 31 11/11/2020   TSH 1.30 12/11/2019   HGBA1C 5.7 11/11/2020         Assessment & Plan:   Physical exam: Screening blood work   ordered Exercise  - active - limited by lumbar radiculopathy Weight  good for age Substance abuse  none   Reviewed recommended immunizations.   Health Maintenance  Topic Date Due   COVID-19 Vaccine (4 - Pfizer risk series) 02/10/2020   DEXA SCAN  12/23/2021   TETANUS/TDAP  01/02/2028   Pneumonia Vaccine 77+ Years old  Completed   Zoster Vaccines- Shingrix  Completed   HPV VACCINES  Aged Out   INFLUENZA VACCINE  Discontinued          See Problem List for Assessment and Plan of chronic medical problems.

## 2021-11-17 NOTE — Patient Instructions (Addendum)
Blood work was ordered.     Medications changes include :   none   Your prescription(s) have been sent to your pharmacy.    Return in about 6 months (around 05/19/2022) for follow up.     Health Maintenance, Female Adopting a healthy lifestyle and getting preventive care are important in promoting health and wellness. Ask your health care provider about: The right schedule for you to have regular tests and exams. Things you can do on your own to prevent diseases and keep yourself healthy. What should I know about diet, weight, and exercise? Eat a healthy diet  Eat a diet that includes plenty of vegetables, fruits, low-fat dairy products, and lean protein. Do not eat a lot of foods that are high in solid fats, added sugars, or sodium. Maintain a healthy weight Body mass index (BMI) is used to identify weight problems. It estimates body fat based on height and weight. Your health care provider can help determine your BMI and help you achieve or maintain a healthy weight. Get regular exercise Get regular exercise. This is one of the most important things you can do for your health. Most adults should: Exercise for at least 150 minutes each week. The exercise should increase your heart rate and make you sweat (moderate-intensity exercise). Do strengthening exercises at least twice a week. This is in addition to the moderate-intensity exercise. Spend less time sitting. Even light physical activity can be beneficial. Watch cholesterol and blood lipids Have your blood tested for lipids and cholesterol at 86 years of age, then have this test every 5 years. Have your cholesterol levels checked more often if: Your lipid or cholesterol levels are high. You are older than 86 years of age. You are at high risk for heart disease. What should I know about cancer screening? Depending on your health history and family history, you may need to have cancer screening at various ages. This may  include screening for: Breast cancer. Cervical cancer. Colorectal cancer. Skin cancer. Lung cancer. What should I know about heart disease, diabetes, and high blood pressure? Blood pressure and heart disease High blood pressure causes heart disease and increases the risk of stroke. This is more likely to develop in people who have high blood pressure readings or are overweight. Have your blood pressure checked: Every 3-5 years if you are 75-10 years of age. Every year if you are 26 years old or older. Diabetes Have regular diabetes screenings. This checks your fasting blood sugar level. Have the screening done: Once every three years after age 55 if you are at a normal weight and have a low risk for diabetes. More often and at a younger age if you are overweight or have a high risk for diabetes. What should I know about preventing infection? Hepatitis B If you have a higher risk for hepatitis B, you should be screened for this virus. Talk with your health care provider to find out if you are at risk for hepatitis B infection. Hepatitis C Testing is recommended for: Everyone born from [birth year] through 1963. Anyone with known risk factors for hepatitis C. Sexually transmitted infections (STIs) Get screened for STIs, including gonorrhea and chlamydia, if: You are sexually active and are younger than 86 years of age. You are older than 86 years of age and your health care provider tells you that you are at risk for this type of infection. Your sexual activity has changed since you were last screened, and you  are at increased risk for chlamydia or gonorrhea. Ask your health care provider if you are at risk. Ask your health care provider about whether you are at high risk for HIV. Your health care provider may recommend a prescription medicine to help prevent HIV infection. If you choose to take medicine to prevent HIV, you should first get tested for HIV. You should then be tested every 3 months  for as long as you are taking the medicine. Pregnancy If you are about to stop having your period (premenopausal) and you may become pregnant, seek counseling before you get pregnant. Take 400 to 800 micrograms (mcg) of folic acid every day if you become pregnant. Ask for birth control (contraception) if you want to prevent pregnancy. Osteoporosis and menopause Osteoporosis is a disease in which the bones lose minerals and strength with aging. This can result in bone fractures. If you are 40 years old or older, or if you are at risk for osteoporosis and fractures, ask your health care provider if you should: Be screened for bone loss. Take a calcium or vitamin D supplement to lower your risk of fractures. Be given hormone replacement therapy (HRT) to treat symptoms of menopause. Follow these instructions at home: Alcohol use Do not drink alcohol if: Your health care provider tells you not to drink. You are pregnant, may be pregnant, or are planning to become pregnant. If you drink alcohol: Limit how much you have to: 0-1 drink a day. Know how much alcohol is in your drink. In the [place] one drink equals one 12 oz bottle of beer (355 mL), one 5 oz glass of wine (148 mL), or one 1 oz glass of hard liquor (44 mL). Lifestyle Do not use any products that contain nicotine or tobacco. These products include cigarettes, chewing tobacco, and vaping devices, such as e-cigarettes. If you need help quitting, ask your health care provider. Do not use street drugs. Do not share needles. Ask your health care provider for help if you need support or information about quitting drugs. General instructions Schedule regular health, dental, and eye exams. Stay current with your vaccines. Tell your health care provider if: You often feel depressed. You have ever been abused or do not feel safe at home. Summary Adopting a healthy lifestyle and getting preventive care are important in promoting health and  wellness. Follow your health care provider's instructions about healthy diet, exercising, and getting tested or screened for diseases. Follow your health care provider's instructions on monitoring your cholesterol and blood pressure. This information is not intended to replace advice given to you by your health care provider. Make sure you discuss any questions you have with your health care provider. Document Revised: 07/20/2020 Document Reviewed: 07/20/2020 Elsevier Patient Education  [employer].

## 2021-11-18 ENCOUNTER — Ambulatory Visit (INDEPENDENT_AMBULATORY_CARE_PROVIDER_SITE_OTHER): Payer: Medicare Other | Admitting: Internal Medicine

## 2021-11-18 VITALS — BP 128/70 | HR 76 | Temp 97.9°F | Ht 61.0 in | Wt 142.2 lb

## 2021-11-18 DIAGNOSIS — M810 Age-related osteoporosis without current pathological fracture: Secondary | ICD-10-CM

## 2021-11-18 DIAGNOSIS — F419 Anxiety disorder, unspecified: Secondary | ICD-10-CM | POA: Diagnosis not present

## 2021-11-18 DIAGNOSIS — N1831 Chronic kidney disease, stage 3a: Secondary | ICD-10-CM

## 2021-11-18 DIAGNOSIS — R7303 Prediabetes: Secondary | ICD-10-CM

## 2021-11-18 DIAGNOSIS — I10 Essential (primary) hypertension: Secondary | ICD-10-CM

## 2021-11-18 DIAGNOSIS — Z0000 Encounter for general adult medical examination without abnormal findings: Secondary | ICD-10-CM

## 2021-11-18 LAB — CBC WITH DIFFERENTIAL/PLATELET
Basophils Absolute: 0 10*3/uL (ref 0.0–0.1)
Basophils Relative: 0.9 % (ref 0.0–3.0)
Eosinophils Absolute: 0.2 10*3/uL (ref 0.0–0.7)
Eosinophils Relative: 4.5 % (ref 0.0–5.0)
HCT: 39.4 % (ref 36.0–46.0)
Hemoglobin: 13 g/dL (ref 12.0–15.0)
Lymphocytes Relative: 22.8 % (ref 12.0–46.0)
Lymphs Abs: 1.1 10*3/uL (ref 0.7–4.0)
MCHC: 32.9 g/dL (ref 30.0–36.0)
MCV: 94.4 fl (ref 78.0–100.0)
Monocytes Absolute: 0.5 10*3/uL (ref 0.1–1.0)
Monocytes Relative: 9.7 % (ref 3.0–12.0)
Neutro Abs: 3 10*3/uL (ref 1.4–7.7)
Neutrophils Relative %: 62.1 % (ref 43.0–77.0)
Platelets: 142 10*3/uL — ABNORMAL LOW (ref 150.0–400.0)
RBC: 4.18 Mil/uL (ref 3.87–5.11)
RDW: 14.1 % (ref 11.5–15.5)
WBC: 4.9 10*3/uL (ref 4.0–10.5)

## 2021-11-18 LAB — COMPREHENSIVE METABOLIC PANEL
ALT: 10 U/L (ref 0–35)
AST: 14 U/L (ref 0–37)
Albumin: 3.8 g/dL (ref 3.5–5.2)
Alkaline Phosphatase: 70 U/L (ref 39–117)
BUN: 28 mg/dL — ABNORMAL HIGH (ref 6–23)
CO2: 28 mEq/L (ref 19–32)
Calcium: 10 mg/dL (ref 8.4–10.5)
Chloride: 104 mEq/L (ref 96–112)
Creatinine, Ser: 1.13 mg/dL (ref 0.40–1.20)
GFR: 43.94 mL/min — ABNORMAL LOW (ref >60.00)
Glucose, Bld: 86 mg/dL (ref 70–99)
Potassium: 4.4 mEq/L (ref 3.5–5.1)
Sodium: 141 mEq/L (ref 135–145)
Total Bilirubin: 1 mg/dL (ref 0.2–1.2)
Total Protein: 6.7 g/dL (ref 6.0–8.3)

## 2021-11-18 LAB — HEMOGLOBIN A1C: Hgb A1c MFr Bld: 5.7 % (ref 4.6–6.5)

## 2021-11-18 LAB — VITAMIN D 25 HYDROXY (VIT D DEFICIENCY, FRACTURES): VITD: 54.34 ng/mL (ref 30.00–100.00)

## 2021-11-18 MED ORDER — SERTRALINE HCL 25 MG PO TABS: 25.0000 mg | ORAL_TABLET | Freq: Every day | ORAL | 3 refills | Status: DC

## 2021-11-18 MED ORDER — BENAZEPRIL HCL 5 MG PO TABS: 5.0000 mg | ORAL_TABLET | Freq: Every day | ORAL | 3 refills | Status: DC

## 2021-11-18 NOTE — Assessment & Plan Note (Signed)
Chronic Cmp, cbc Mild, stable

## 2021-11-18 NOTE — Assessment & Plan Note (Signed)
Chronic dexa due - ordered recently Taking calcium and vitamin d As active as tolerated

## 2021-11-18 NOTE — Assessment & Plan Note (Signed)
Chronic BP well controlled Continue benazepril 5 mg daily cmp

## 2021-11-18 NOTE — Assessment & Plan Note (Signed)
Chronic Controlled, stable Continue sertraline 25 mg daily  

## 2021-11-18 NOTE — Assessment & Plan Note (Signed)
Chronic Check a1c Low sugar / carb diet Stressed regular exercise  

## 2021-11-19 ENCOUNTER — Encounter: Payer: Self-pay | Admitting: Internal Medicine

## 2021-12-10 DIAGNOSIS — H353131 Nonexudative age-related macular degeneration, bilateral, early dry stage: Secondary | ICD-10-CM | POA: Diagnosis not present

## 2021-12-10 DIAGNOSIS — Z961 Presence of intraocular lens: Secondary | ICD-10-CM | POA: Diagnosis not present

## 2022-01-10 DIAGNOSIS — H903 Sensorineural hearing loss, bilateral: Secondary | ICD-10-CM | POA: Diagnosis not present

## 2022-02-09 ENCOUNTER — Encounter (HOSPITAL_BASED_OUTPATIENT_CLINIC_OR_DEPARTMENT_OTHER): Payer: Self-pay

## 2022-02-09 ENCOUNTER — Other Ambulatory Visit: Payer: Self-pay

## 2022-02-09 ENCOUNTER — Emergency Department (HOSPITAL_BASED_OUTPATIENT_CLINIC_OR_DEPARTMENT_OTHER): Payer: Medicare Other

## 2022-02-09 ENCOUNTER — Emergency Department (HOSPITAL_BASED_OUTPATIENT_CLINIC_OR_DEPARTMENT_OTHER)
Admission: EM | Admit: 2022-02-09 | Discharge: 2022-02-09 | Disposition: A | Discharge status: Home / Self Care | Payer: Medicare Other | Attending: Emergency Medicine | Admitting: Emergency Medicine

## 2022-02-09 DIAGNOSIS — S0990XA Unspecified injury of head, initial encounter: Secondary | ICD-10-CM | POA: Diagnosis present

## 2022-02-09 DIAGNOSIS — S0003XA Contusion of scalp, initial encounter: Secondary | ICD-10-CM | POA: Insufficient documentation

## 2022-02-09 DIAGNOSIS — W108XXA Fall (on) (from) other stairs and steps, initial encounter: Secondary | ICD-10-CM | POA: Diagnosis not present

## 2022-02-09 DIAGNOSIS — W19XXXA Unspecified fall, initial encounter: Secondary | ICD-10-CM

## 2022-02-09 NOTE — ED Triage Notes (Signed)
Patient here POV from Home.  Endorses Tripping while stepping down some Concrete Steps (4 Approximately). Positive Head Injury to Steps. No LOC.No Anticoagulants. Occurred approximately 1 Hour ago.  Some Bruising to Right Hand and some Soreness to head. No C-Spine Tenderness or Pain.  NAD Noted during Triage. A&Ox4. GCS 15. Ambulatory.

## 2022-02-09 NOTE — Discharge Instructions (Signed)
Your head CT did not show any bleeding on the inside the skull.  The goose egg on your head should go away on its own.  Please follow-up with your family doctor in the office.  Typically they want to evaluate you after every fall to make sure that there is no medications that you are on that they could may be take off or evaluate to see if you need physical therapy or something else to keep you from falling.

## 2022-02-09 NOTE — ED Provider Notes (Signed)
[facility] EMERGENCY DEPT Provider Note   CSN: [id number] Arrival date & time: 02/09/22  1114     History  Chief Complaint  Patient presents with   Fall    Barbara [name] is a 87 y.o. female.  88 yo F with a cc of a fall.  Was going to get her hair done and fell down about 4 concrete steps.  Struck the right posterior aspect of her head when she fell.  She also thinks that she might of scraped her right hand but denies any significant injury to it.  Has been ambulatory afterwards.  Is felt a little bit dizzy off and on.  Denies blood thinner use denies neck pain denies chest pain denies abdominal pain denies back pain.   Fall       Home Medications Prior to Admission medications   Medication Sig Start Date End Date Taking? Authorizing Provider  benazepril (LOTENSIN) 5 MG tablet Take 1 tablet (5 mg total) by mouth daily. 11/18/21   Binnie Rail, MD  Calcium-Vitamin D-Vitamin K [id number] MG-UNT-MCG CHEW Chew by mouth. TAKE 2 A DAY    [provider]  diclofenac sodium (VOLTAREN) 1 % GEL  07/18/18   [provider]  fish oil-omega-3 fatty acids 1000 MG capsule Take 2 g by mouth daily.    [provider]  Methylsulfonylmethane (MSM PO) Take by mouth.    [provider]  NON FORMULARY Slimfast powder    [provider]  Nutritional Supplements (ANTI-INFLAMMATORY ENZYME) CAPS APPLY 1 2 GMS TO AFFECTED AREA 3 4 TIMES DAILY AS NEEDED FOR PAIN  THIS IS A COMPOUNDED CREAM 1 PUMP EQUALS 1 GRAM 08/30/16   [provider]  sertraline (ZOLOFT) 25 MG tablet Take 1 tablet (25 mg total) by mouth daily. 11/18/21   Binnie Rail, MD  Vitamin D3 (VITAMIN D) 25 MCG tablet Take 1,000 Units by mouth daily.    [provider]      Allergies    Sertraline hcl, Amlodipine, Fosamax [alendronate], Lexapro [escitalopram], Metoprolol, and Telmisartan    Review of Systems   Review of Systems  Physical Exam Updated Vital  Signs BP (!) 195/81 (BP Location: Right Arm)   Pulse 69   Temp 97.6 F (36.4 C) (Temporal)   Resp 18   Ht 5\' 1"  (1.549 m)   Wt 64.5 kg   SpO2 99%   BMI 26.87 kg/m  Physical Exam Vitals and nursing note reviewed.  Constitutional:      General: She is not in acute distress.    Appearance: She is well-developed. She is not diaphoretic.  HENT:     Head: Normocephalic.     Comments: R occiput with hematoma. No midline c spine tenderness.  Eyes:     Pupils: Pupils are equal, round, and reactive to light.  Cardiovascular:     Rate and Rhythm: Normal rate and regular rhythm.     Heart sounds: No murmur heard.    No friction rub. No gallop.  Pulmonary:     Effort: Pulmonary effort is normal.     Breath sounds: No wheezing or rales.  Abdominal:     General: There is no distension.     Palpations: Abdomen is soft.     Tenderness: There is no abdominal tenderness.  Musculoskeletal:        General: No tenderness.     Cervical back: Normal range of motion and neck supple.     Comments: Bandaid to  the right thumb, full rom.    Skin:    General: Skin is warm and dry.  Neurological:     Mental Status: She is alert and oriented to person, place, and time.  Psychiatric:        Behavior: Behavior normal.     ED Results / Procedures / Treatments   Labs (all labs ordered are listed, but only abnormal results are displayed) Labs Reviewed - No data to display  EKG None  Radiology CT Head Wo Contrast  Result Date: 02/09/2022 CLINICAL DATA:  Fall, head trauma EXAM: CT HEAD WITHOUT CONTRAST TECHNIQUE: Contiguous axial images were obtained from the base of the skull through the vertex without intravenous contrast. RADIATION DOSE REDUCTION: This exam was performed according to the departmental dose-optimization program which includes automated exposure control, adjustment of the mA and/or kV according to patient size and/or use of iterative reconstruction technique. COMPARISON:  None  Available. FINDINGS: Brain: Mild to moderate generalized atrophy. Moderate white matter hypodensity bilaterally is symmetric and likely chronic. Negative for acute infarct, intracranial hemorrhage, mass Vascular: Negative for hyperdense vessel Skull: Negative Sinuses/Orbits: Paranasal sinuses clear. Bilateral cataract extraction. Other: Large scalp hematoma right parietal region. IMPRESSION: 1. No acute intracranial abnormality. Atrophy and chronic microvascular ischemia. 2. Large scalp hematoma right parietal region. Electronically Signed   By: Marlan Palau M.D.   On: 02/09/2022 12:13    Procedures Procedures    Medications Ordered in ED Medications - No data to display  ED Course/ Medical Decision Making/ A&P                           Medical Decision Making Amount and/or Complexity of Data Reviewed Radiology: ordered.   88 yo F with a cc of a fall.  Nonsyncopal fall.  Concerned mostly about a R occipital hematoma.  CT head.   CT of the head independently interpreted by me without fracture or intracranial hemorrhage.  Patient does have a Band-Aid on her hand I offered to evaluate and she declined.  Encouraged PCP follow-up.  12:20 PM:  I have discussed the diagnosis/risks/treatment options with the patient and family.  Evaluation and diagnostic testing in the emergency department does not suggest an emergent condition requiring admission or immediate intervention beyond what has been performed at this time.  They will follow up with PCP. We also discussed returning to the ED immediately if new or worsening sx occur. We discussed the sx which are most concerning (e.g., sudden worsening pain, fever, inability to tolerate by mouth) that necessitate immediate return. Medications administered to the patient during their visit and any new prescriptions provided to the patient are listed below.  Medications given during this visit Medications - No data to display   The patient appears  reasonably screen and/or stabilized for discharge and I doubt any other medical condition or other [facility] requiring further screening, evaluation, or treatment in the ED at this time prior to discharge.          Final Clinical Impression(s) / ED Diagnoses Final diagnoses:  Fall, initial encounter  Hematoma of occipital region of scalp    Rx / DC Orders ED Discharge Orders     None         Melene Plan, DO 02/09/22 1220

## 2022-02-09 NOTE — ED Notes (Signed)
Discharge paperwork given and verbally understood. 

## 2022-02-10 ENCOUNTER — Encounter: Payer: Self-pay | Admitting: Internal Medicine

## 2022-02-10 ENCOUNTER — Telehealth: Payer: Self-pay

## 2022-02-10 NOTE — Telephone Encounter (Signed)
Patient declined ER follow-up appointment.  Patient stated that she is resting and applying ice to her head and watching very carefully.  Patient also sent a Mychart message to Dr. Lawerance Bach earlier this morning.  Shaquon Gropp N. Reymundo Poll, LPN Nurse Health Advisor [facility]  Site: [facility] Direct Dial: [phone] fax

## 2022-02-13 ENCOUNTER — Encounter: Payer: Self-pay | Admitting: Internal Medicine

## 2022-02-13 DIAGNOSIS — W19XXXA Unspecified fall, initial encounter: Secondary | ICD-10-CM | POA: Insufficient documentation

## 2022-02-13 DIAGNOSIS — S0990XA Unspecified injury of head, initial encounter: Secondary | ICD-10-CM | POA: Insufficient documentation

## 2022-02-13 NOTE — Patient Instructions (Addendum)
       Medications changes include :   continue the tylenol as needed.  You can use the heating pad on the bruises.         Return for follow up as scheduled.

## 2022-02-13 NOTE — Progress Notes (Unsigned)
Subjective:    Patient ID: Barbara [name], female    DOB: Jan 04, 1933, 88 y.o.   MRN: [id number]     HPI Barbara [name] is here for follow from the hospital.   ED 11/29 after fall  - she fell down 4 concrete stairs.  Struck right posterior head and scraped right hand.  She was ambulatory after.  She had mild intermittent dizziness.  No neck pain, chest pain, abd pain.  R occiput with hematoma.  No c-spine tenderness.  Ct head w/o fx or intracranial hemorrhage.    Blood pressure was very high in the ED, but she thinks the cuff was too tight.  He was controlled today at home-a little higher than it was here today. The right posterior head and back hurt.  She has a bruise on her right buttock.  She is taking tylenol.  She denies headaches, lightheadedness or dizziness.     Medications and allergies reviewed with patient and updated if appropriate.  Current Outpatient Medications on File Prior to Visit  Medication Sig Dispense Refill   benazepril (LOTENSIN) 5 MG tablet Take 1 tablet (5 mg total) by mouth daily. 90 tablet 3   Calcium-Vitamin D-Vitamin K 500-500-40 MG-UNT-MCG CHEW Chew by mouth. TAKE 2 A DAY     diclofenac sodium (VOLTAREN) 1 % GEL      fish oil-omega-3 fatty acids 1000 MG capsule Take 2 g by mouth daily.     Methylsulfonylmethane (MSM PO) Take by mouth.     NON FORMULARY Slimfast powder     Nutritional Supplements (ANTI-INFLAMMATORY ENZYME) CAPS APPLY 1 2 GMS TO AFFECTED AREA 3 4 TIMES DAILY AS NEEDED FOR PAIN  THIS IS A COMPOUNDED CREAM 1 PUMP EQUALS 1 GRAM  1   sertraline (ZOLOFT) 25 MG tablet Take 1 tablet (25 mg total) by mouth daily. 90 tablet 3   Vitamin D3 (VITAMIN D) 25 MCG tablet Take 1,000 Units by mouth daily.     No current facility-administered medications on file prior to visit.     Review of Systems  Eyes:  Negative for visual disturbance.  Gastrointestinal:  Negative for nausea.  Neurological:  Negative for dizziness, light-headedness and headaches  (head pain where she hit).       Objective:   Vitals:   02/17/22 1043  BP: 114/62  Pulse: 78  Temp: 98 F (36.7 C)  SpO2: 99%   BP Readings from Last 3 Encounters:  02/17/22 114/62  02/09/22 (!) 191/64  11/18/21 128/70   Wt Readings from Last 3 Encounters:  02/17/22 142 lb (64.4 kg)  02/09/22 142 lb 3.2 oz (64.5 kg)  11/18/21 142 lb 3.2 oz (64.5 kg)   Body mass index is 26.83 kg/m.    Physical Exam Constitutional:      General: She is not in acute distress.    Appearance: Normal appearance. She is not ill-appearing.  HENT:     Head: Normocephalic.     Comments: Right mid parietal region hematoma that is soft and minimally tender approximately the size 2-1/2 cm-per patient it has decreased in size significantly.  Slight scabbed abrasion just posterior to the hematoma.  Mild surrounding ecchymosis that is nontender, no other scalp abnormalities Musculoskeletal:     Cervical back: Neck supple. No tenderness.     Right lower leg: No edema.     Left lower leg: No edema.     Comments: No posterior neck or cervical spine tenderness.  Good range of motion.  Lymphadenopathy:     Cervical: No cervical adenopathy.  Skin:    General: Skin is warm and dry.  Neurological:     General: No focal deficit present.     Mental Status: She is alert.  Psychiatric:        Mood and Affect: Mood normal.        Lab Results  Component Value Date   WBC 4.9 11/18/2021   HGB 13.0 11/18/2021   HCT 39.4 11/18/2021   PLT 142.0 (L) 11/18/2021   GLUCOSE 86 11/18/2021   CHOL 197 11/11/2020   TRIG 127.0 11/11/2020   HDL 79.00 11/11/2020   LDLCALC 93 11/11/2020   ALT 10 11/18/2021   AST 14 11/18/2021   NA 141 11/18/2021   K 4.4 11/18/2021   CL 104 11/18/2021   CREATININE 1.13 11/18/2021   BUN 28 (H) 11/18/2021   CO2 28 11/18/2021   TSH 1.30 12/11/2019   HGBA1C 5.7 11/18/2021     Assessment & Plan:    See Problem List for Assessment and Plan of chronic medical problems.

## 2022-02-17 ENCOUNTER — Ambulatory Visit (INDEPENDENT_AMBULATORY_CARE_PROVIDER_SITE_OTHER): Payer: Medicare Other | Admitting: Internal Medicine

## 2022-02-17 VITALS — BP 114/62 | HR 78 | Temp 98.0°F | Ht 61.0 in | Wt 142.0 lb

## 2022-02-17 DIAGNOSIS — W19XXXA Unspecified fall, initial encounter: Secondary | ICD-10-CM

## 2022-02-17 DIAGNOSIS — I10 Essential (primary) hypertension: Secondary | ICD-10-CM | POA: Diagnosis not present

## 2022-02-17 DIAGNOSIS — S0990XA Unspecified injury of head, initial encounter: Secondary | ICD-10-CM

## 2022-02-17 NOTE — Assessment & Plan Note (Signed)
Chronic Blood pressure very high in the ED, which she thinks was related to the cuff being too tight Blood pressure has been well-controlled at home and is very well-controlled here today Continue benazepril 5 mg daily

## 2022-02-17 NOTE — Assessment & Plan Note (Signed)
CT scan in the ED showed no acute bleeding Hematoma improving, bruising improving-does have some pain where she hit her head, but no headaches, lightheadedness or dizziness Continue Tylenol as needed She has been icing her head-can continue to ice or apply heat

## 2022-02-17 NOTE — Assessment & Plan Note (Signed)
Recent fall in 11/29-fell down 4 concrete stairs and hit the posterior head, no LOC CT scan in the ED negative for acute bleed Residual hematoma that is improving and ecchymosis No concerning headache, dizziness, lightheadedness, blurry vision or nausea She is fearful of falling again Discussed PT-she deferred for now She does think she may not always pick up her feet when she is walking-may shuffle a little and she will work on that

## 2022-03-01 ENCOUNTER — Telehealth: Payer: Self-pay

## 2022-03-01 NOTE — Telephone Encounter (Signed)
     Patient  visit on 02/09/2022  at [facility] was for fall.  Have you been able to follow up with your primary care physician? Yes, on 02/17/2022.  The patient was or was not able to obtain any needed medicine or equipment. No medications prescribed.  Are there diet recommendations that you are having difficulty following? No dietary recommendations.  Patient expresses understanding of discharge instructions and education provided has no other needs at this time.    Barbara [name] [facility]  [facility] Population Health Community Resource Care Guide   [name]  ?? [phone]   Website: [email or web address]  [email or web address]

## 2022-03-18 ENCOUNTER — Telehealth: Payer: Self-pay | Admitting: Internal Medicine

## 2022-03-18 DIAGNOSIS — R42 Dizziness and giddiness: Secondary | ICD-10-CM

## 2022-03-18 NOTE — Telephone Encounter (Signed)
Patient called and she has an appointment at [facility] - this afternoon at 2:00 - she needs a referral sent ASAP so she can keep this appointment - Fax # [phone]

## 2022-03-18 NOTE — Telephone Encounter (Signed)
Referral ordered

## 2022-03-18 NOTE — Telephone Encounter (Signed)
Referral faxed today with last office note

## 2022-03-19 ENCOUNTER — Encounter: Payer: Self-pay | Admitting: Internal Medicine

## 2022-03-19 DIAGNOSIS — H547 Unspecified visual loss: Secondary | ICD-10-CM

## 2022-03-21 ENCOUNTER — Encounter: Payer: Self-pay | Admitting: Internal Medicine

## 2022-03-29 ENCOUNTER — Encounter (INDEPENDENT_AMBULATORY_CARE_PROVIDER_SITE_OTHER): Payer: Medicare Other | Admitting: Internal Medicine

## 2022-03-29 DIAGNOSIS — I10 Essential (primary) hypertension: Secondary | ICD-10-CM

## 2022-03-30 DIAGNOSIS — I10 Essential (primary) hypertension: Secondary | ICD-10-CM

## 2022-03-30 MED ORDER — BENAZEPRIL HCL 5 MG PO TABS: 7.5000 mg | ORAL_TABLET | Freq: Every day | ORAL | 3 refills | Status: DC

## 2022-03-30 NOTE — Telephone Encounter (Signed)
Please see the MyChart message reply(ies) for my assessment and plan.   The patient gave consent for this Medical Advice Message and is aware that it may result in a bill to their insurance company as well as the possibility that this may result in a co-payment or deductible. They are an established patient, but are not seeking medical advice exclusively about a problem treated during an in person or video visit in the last 7 days. I did not recommend an in person or video visit within 7 days of my reply.   I spent a total of 5 minutes cumulative time within 7 days through [employer], including chart review, adjusting medication list and responding to patient.   Binnie Rail, MD

## 2022-05-18 ENCOUNTER — Encounter: Payer: Self-pay | Admitting: Internal Medicine

## 2022-05-18 NOTE — Patient Instructions (Addendum)
      Blood work was ordered.   The lab is on the first floor.    Medications changes include :   None     Return in about 6 months (around 11/19/2022) for Physical Exam.

## 2022-05-19 ENCOUNTER — Ambulatory Visit (INDEPENDENT_AMBULATORY_CARE_PROVIDER_SITE_OTHER): Payer: Medicare Other | Admitting: Internal Medicine

## 2022-05-19 ENCOUNTER — Encounter: Payer: Self-pay | Admitting: Internal Medicine

## 2022-05-19 VITALS — BP 124/72 | HR 75 | Temp 98.1°F | Ht 61.0 in | Wt 143.0 lb

## 2022-05-19 DIAGNOSIS — I10 Essential (primary) hypertension: Secondary | ICD-10-CM | POA: Diagnosis not present

## 2022-05-19 DIAGNOSIS — I700 Atherosclerosis of aorta: Secondary | ICD-10-CM

## 2022-05-19 DIAGNOSIS — F419 Anxiety disorder, unspecified: Secondary | ICD-10-CM | POA: Diagnosis not present

## 2022-05-19 DIAGNOSIS — M810 Age-related osteoporosis without current pathological fracture: Secondary | ICD-10-CM

## 2022-05-19 DIAGNOSIS — N1831 Chronic kidney disease, stage 3a: Secondary | ICD-10-CM | POA: Diagnosis not present

## 2022-05-19 DIAGNOSIS — H8111 Benign paroxysmal vertigo, right ear: Secondary | ICD-10-CM

## 2022-05-19 DIAGNOSIS — R7303 Prediabetes: Secondary | ICD-10-CM

## 2022-05-19 LAB — CBC WITH DIFFERENTIAL/PLATELET
Basophils Absolute: 0 10*3/uL (ref 0.0–0.1)
Basophils Relative: 0.8 % (ref 0.0–3.0)
Eosinophils Absolute: 0.1 10*3/uL (ref 0.0–0.7)
Eosinophils Relative: 2.1 % (ref 0.0–5.0)
HCT: 41.5 % (ref 36.0–46.0)
Hemoglobin: 13.5 g/dL (ref 12.0–15.0)
Lymphocytes Relative: 20 % (ref 12.0–46.0)
Lymphs Abs: 1 10*3/uL (ref 0.7–4.0)
MCHC: 32.6 g/dL (ref 30.0–36.0)
MCV: 93.3 fl (ref 78.0–100.0)
Monocytes Absolute: 0.4 10*3/uL (ref 0.1–1.0)
Monocytes Relative: 8.8 % (ref 3.0–12.0)
Neutro Abs: 3.4 10*3/uL (ref 1.4–7.7)
Neutrophils Relative %: 68.3 % (ref 43.0–77.0)
Platelets: 165 10*3/uL (ref 150.0–400.0)
RBC: 4.45 Mil/uL (ref 3.87–5.11)
RDW: 14.1 % (ref 11.5–15.5)
WBC: 5 10*3/uL (ref 4.0–10.5)

## 2022-05-19 LAB — COMPREHENSIVE METABOLIC PANEL
ALT: 11 U/L (ref 0–35)
AST: 16 U/L (ref 0–37)
Albumin: 3.9 g/dL (ref 3.5–5.2)
Alkaline Phosphatase: 75 U/L (ref 39–117)
BUN: 29 mg/dL — ABNORMAL HIGH (ref 6–23)
CO2: 31 mEq/L (ref 19–32)
Calcium: 10.4 mg/dL (ref 8.4–10.5)
Chloride: 103 mEq/L (ref 96–112)
Creatinine, Ser: 1.13 mg/dL (ref 0.40–1.20)
GFR: 43.79 mL/min — ABNORMAL LOW (ref >60.00)
Glucose, Bld: 78 mg/dL (ref 70–99)
Potassium: 5.2 mEq/L — ABNORMAL HIGH (ref 3.5–5.1)
Sodium: 140 mEq/L (ref 135–145)
Total Bilirubin: 1 mg/dL (ref 0.2–1.2)
Total Protein: 6.9 g/dL (ref 6.0–8.3)

## 2022-05-19 LAB — HEMOGLOBIN A1C: Hgb A1c MFr Bld: 5.7 % (ref 4.6–6.5)

## 2022-05-19 NOTE — Assessment & Plan Note (Signed)
Chronic Blood pressure has been well-controlled at home and is very well-controlled here today Continue benazepril 7.5 mg daily

## 2022-05-19 NOTE — Assessment & Plan Note (Signed)
Chronic Lab Results  Component Value Date   LDLCALC 93 11/11/2020    deferred statin Encouraged regular exercise, healthy diet

## 2022-05-19 NOTE — Assessment & Plan Note (Signed)
Chronic Kidney function has been very stable Blood pressure well-controlled CMP, CBC

## 2022-05-19 NOTE — Assessment & Plan Note (Signed)
Chronic Check a1c Low sugar / carb diet Stressed regular exercise  

## 2022-05-19 NOTE — Assessment & Plan Note (Addendum)
Subacute Has had it in the past and had it since her fall in December Vertigo is positional and she has had it in the past Referral to vestibular PT deferred by patient Will see ENT

## 2022-05-19 NOTE — Progress Notes (Signed)
Subjective:    Patient ID: Barbara [name], female    DOB: [birth year], 89 y.o.   MRN: [id number]     HPI Barbara [name] is here for follow up of her chronic medical problems.  Still has some vertigo - it is positional.  Since her fall she has a tender spot on the right side of her head.    She is compliant with a low sugar diet.  She is less active.     Medications and allergies reviewed with patient and updated if appropriate.  Current Outpatient Medications on File Prior to Visit  Medication Sig Dispense Refill   benazepril (LOTENSIN) 5 MG tablet Take 1.5 tablets (7.5 mg total) by mouth daily. 135 tablet 3   Calcium-Vitamin D-Vitamin K [id number] MG-UNT-MCG CHEW Chew by mouth. TAKE 2 A DAY     diclofenac sodium (VOLTAREN) 1 % GEL      fish oil-omega-3 fatty acids 1000 MG capsule Take 2 g by mouth daily.     Methylsulfonylmethane (MSM PO) Take by mouth.     Nutritional Supplements (ANTI-INFLAMMATORY ENZYME) CAPS APPLY 1 2 GMS TO AFFECTED AREA 3 4 TIMES DAILY AS NEEDED FOR PAIN  THIS IS A COMPOUNDED CREAM 1 PUMP EQUALS 1 GRAM  1   sertraline (ZOLOFT) 25 MG tablet Take 1 tablet (25 mg total) by mouth daily. 90 tablet 3   Vitamin D3 (VITAMIN D) 25 MCG tablet Take 1,000 Units by mouth daily.     No current facility-administered medications on file prior to visit.     Review of Systems  Constitutional:  Negative for fever.  Respiratory:  Negative for cough, shortness of breath and wheezing.   Cardiovascular:  Negative for chest pain, palpitations and leg swelling.  Neurological:  Positive for dizziness. Negative for light-headedness and headaches.       Objective:   Vitals:   05/19/22 0951  BP: 124/72  Pulse: 75  Temp: 98.1 F (36.7 C)  SpO2: 98%   BP Readings from Last 3 Encounters:  05/19/22 124/72  02/17/22 114/62  02/09/22 (!) 191/64   Wt Readings from Last 3 Encounters:  05/19/22 143 lb (64.9 kg)  02/17/22 142 lb (64.4 kg)  02/09/22 142 lb 3.2 oz (64.5 kg)    Body mass index is 27.02 kg/m.    Physical Exam Constitutional:      General: She is not in acute distress.    Appearance: Normal appearance.  HENT:     Head: Normocephalic and atraumatic.  Eyes:     Conjunctiva/sclera: Conjunctivae normal.  Cardiovascular:     Rate and Rhythm: Normal rate and regular rhythm.     Heart sounds: Normal heart sounds.  Pulmonary:     Effort: Pulmonary effort is normal. No respiratory distress.     Breath sounds: Normal breath sounds. No wheezing.  Musculoskeletal:     Cervical back: Neck supple.     Right lower leg: No edema.     Left lower leg: No edema.  Lymphadenopathy:     Cervical: No cervical adenopathy.  Skin:    General: Skin is warm and dry.     Findings: No rash.  Neurological:     Mental Status: She is alert. Mental status is at baseline.  Psychiatric:        Mood and Affect: Mood normal.        Behavior: Behavior normal.        Lab Results  Component Value Date   WBC 4.9  11/18/2021   HGB 13.0 11/18/2021   HCT 39.4 11/18/2021   PLT 142.0 (L) 11/18/2021   GLUCOSE 86 11/18/2021   CHOL 197 11/11/2020   TRIG 127.0 11/11/2020   HDL 79.00 11/11/2020   LDLCALC 93 11/11/2020   ALT 10 11/18/2021   AST 14 11/18/2021   NA 141 11/18/2021   K 4.4 11/18/2021   CL 104 11/18/2021   CREATININE 1.13 11/18/2021   BUN 28 (H) 11/18/2021   CO2 28 11/18/2021   TSH 1.30 12/11/2019   HGBA1C 5.7 11/18/2021     Assessment & Plan:    See Problem List for Assessment and Plan of chronic medical problems.

## 2022-05-19 NOTE — Assessment & Plan Note (Signed)
Chronic DEXA scheduled for 3/15 Continue calcium, vitamin D daily Encourage as much activity as possible Did not tolerate Fosamax in the past Could consider Prolia or Evenity depending on results

## 2022-05-19 NOTE — Assessment & Plan Note (Signed)
Chronic Controlled, Stable Continue sertraline 25 mg daily

## 2022-05-27 ENCOUNTER — Ambulatory Visit
Admission: RE | Admit: 2022-05-27 | Discharge: 2022-05-27 | Disposition: A | Discharge status: Home / Self Care | Payer: Medicare Other | Source: Ambulatory Visit | Attending: Internal Medicine | Admitting: Internal Medicine

## 2022-05-27 DIAGNOSIS — M85851 Other specified disorders of bone density and structure, right thigh: Secondary | ICD-10-CM | POA: Diagnosis not present

## 2022-05-27 DIAGNOSIS — Z13820 Encounter for screening for osteoporosis: Secondary | ICD-10-CM

## 2022-05-27 DIAGNOSIS — M810 Age-related osteoporosis without current pathological fracture: Secondary | ICD-10-CM | POA: Diagnosis not present

## 2022-05-27 DIAGNOSIS — Z780 Asymptomatic menopausal state: Secondary | ICD-10-CM | POA: Diagnosis not present

## 2022-09-19 DIAGNOSIS — H6123 Impacted cerumen, bilateral: Secondary | ICD-10-CM | POA: Diagnosis not present

## 2022-09-29 DIAGNOSIS — H5711 Ocular pain, right eye: Secondary | ICD-10-CM | POA: Diagnosis not present

## 2022-09-29 DIAGNOSIS — H01002 Unspecified blepharitis right lower eyelid: Secondary | ICD-10-CM | POA: Diagnosis not present

## 2022-11-22 ENCOUNTER — Other Ambulatory Visit: Payer: Self-pay | Admitting: Internal Medicine

## 2022-11-22 ENCOUNTER — Encounter: Payer: Self-pay | Admitting: Internal Medicine

## 2022-11-22 ENCOUNTER — Ambulatory Visit (INDEPENDENT_AMBULATORY_CARE_PROVIDER_SITE_OTHER): Payer: Medicare Other | Admitting: Internal Medicine

## 2022-11-22 VITALS — BP 130/74 | HR 64 | Temp 98.5°F | Ht 61.0 in | Wt 140.4 lb

## 2022-11-22 DIAGNOSIS — R42 Dizziness and giddiness: Secondary | ICD-10-CM | POA: Diagnosis not present

## 2022-11-22 NOTE — Assessment & Plan Note (Addendum)
Chronic Related to position changes, head movement Sounds like BPPV Also has chronic hearing loss and wears hearing aids, noise in her head which sounds like tinnitus Would like referral to ENT-Dr. Loleta Chance Discussed trial of vestibular PT, but she deferred-would like to see Dr. Dorma Russell prior to doing that

## 2022-11-22 NOTE — Progress Notes (Signed)
Subjective:    Patient ID: Barbara [name], female    DOB: [birth year], 89 y.o.   MRN: [id number]      HPI Telsa is here for  Chief Complaint  Patient presents with   Referral    Patient needs a ENT referral to Dr. Ermalinda Barrios in [place].      She would like a referral for ENT - dizziness.    She continues to have vertigo episodes.  She had an episode at the dentist when laying back. She can have one with changes in her head position. She hears a noise in her left head - sounds like an ocean.    No ear pain.  Has chronic hearing loss - wears hearing aids.    She did hit her right side of her head when she fell and that area is still tender which she is concerned about.  CT scan at that time was negative for acute abnormality.  Medications and allergies reviewed with patient and updated if appropriate.  Current Outpatient Medications on File Prior to Visit  Medication Sig Dispense Refill   benazepril (LOTENSIN) 5 MG tablet Take 1.5 tablets (7.5 mg total) by mouth daily. (Patient taking differently: Take 7.5 mg by mouth daily. Patient states that she only takes 1 tablet) 135 tablet 3   Calcium-Vitamin D-Vitamin K 500-500-40 MG-UNT-MCG CHEW Chew by mouth. TAKE 2 A DAY     diclofenac sodium (VOLTAREN) 1 % GEL      fish oil-omega-3 fatty acids 1000 MG capsule Take 2 g by mouth daily.     Methylsulfonylmethane (MSM PO) Take by mouth.     Nutritional Supplements (ANTI-INFLAMMATORY ENZYME) CAPS APPLY 1 2 GMS TO AFFECTED AREA 3 4 TIMES DAILY AS NEEDED FOR PAIN  THIS IS A COMPOUNDED CREAM 1 PUMP EQUALS 1 GRAM  1   sertraline (ZOLOFT) 25 MG tablet Take 1 tablet (25 mg total) by mouth daily. 90 tablet 3   Vitamin D3 (VITAMIN D) 25 MCG tablet Take 1,000 Units by mouth daily.     No current facility-administered medications on file prior to visit.    Review of Systems  Constitutional:  Negative for fever.  HENT:  Positive for hearing loss (wears hearing aids) and tinnitus.  Negative for congestion, ear pain and sinus pain.   Neurological:  Positive for dizziness. Negative for headaches.       Objective:   Vitals:   11/22/22 1537  BP: 130/74  Pulse: 64  Temp: 98.5 F (36.9 C)  SpO2: 97%   BP Readings from Last 3 Encounters:  11/22/22 130/74  05/19/22 124/72  02/17/22 114/62   Wt Readings from Last 3 Encounters:  11/22/22 140 lb 6.4 oz (63.7 kg)  05/19/22 143 lb (64.9 kg)  02/17/22 142 lb (64.4 kg)   Body mass index is 26.53 kg/m.    Physical Exam Constitutional:      General: She is not in acute distress.    Appearance: Normal appearance. She is not ill-appearing.  HENT:     Head: Normocephalic and atraumatic.     Right Ear: Tympanic membrane, ear canal and external ear normal.     Left Ear: Tympanic membrane, ear canal and external ear normal.     Mouth/Throat:     Mouth: Mucous membranes are moist.     Pharynx: No oropharyngeal exudate or posterior oropharyngeal erythema.  Eyes:     Conjunctiva/sclera: Conjunctivae normal.  Cardiovascular:     Rate and Rhythm: Normal  rate and regular rhythm.  Pulmonary:     Effort: Pulmonary effort is normal. No respiratory distress.     Breath sounds: Normal breath sounds. No wheezing or rales.  Skin:    General: Skin is warm and dry.  Neurological:     Mental Status: She is alert.            Assessment & Plan:    See Problem List for Assessment and Plan of chronic medical problems.

## 2022-11-22 NOTE — Patient Instructions (Addendum)
       Medications changes include :   none    A referral was ordered for Dr Dorma Russell and someone will call you to schedule an appointment.

## 2022-11-25 ENCOUNTER — Ambulatory Visit (INDEPENDENT_AMBULATORY_CARE_PROVIDER_SITE_OTHER): Payer: Medicare Other

## 2022-11-25 ENCOUNTER — Encounter: Payer: Self-pay | Admitting: Internal Medicine

## 2022-11-25 ENCOUNTER — Telehealth: Payer: Self-pay | Admitting: Internal Medicine

## 2022-11-25 VITALS — Ht 61.0 in | Wt 136.0 lb

## 2022-11-25 DIAGNOSIS — Z0000 Encounter for general adult medical examination without abnormal findings: Secondary | ICD-10-CM | POA: Diagnosis not present

## 2022-11-25 NOTE — Progress Notes (Signed)
Subjective:   Barbara [name] is a 89 y.o. female who presents for Medicare Annual (Subsequent) preventive examination.  Visit Complete: Virtual  I connected with  Barbara [name] on 11/25/22 by a audio enabled telemedicine application and verified that I am speaking with the correct person using two identifiers.  Patient Location: Home  Provider Location: Office/Clinic  I discussed the limitations of evaluation and management by telemedicine. The patient expressed understanding and agreed to proceed.  Patient Medicare AWV questionnaire was completed by the patient on 11/24/2022; I have confirmed that all information answered by patient is correct and no changes since this date.  Vital Signs: Because this visit was a virtual/telehealth visit, some criteria may be missing or patient reported. Any vitals not documented were not able to be obtained and vitals that have been documented are patient reported.    Cardiac Risk Factors include: advanced age (>36men, >24 women);hypertension;Other (see comment), Risk factor comments: CKD, Osteoporosis, Aortic atherosclerosis,     Objective:    Today's Vitals   11/25/22 1019  Weight: 136 lb (61.7 kg)  Height: 5\' 1"  (1.549 m)   Body mass index is 25.7 kg/m.     11/25/2022   10:42 AM 02/09/2022   11:22 AM 11/11/2021    9:17 AM 11/10/2020    8:58 AM  Advanced Directives  Does Patient Have a Medical Advance Directive? Yes No Yes Yes  Type of Estate agent of [place];Living will  Healthcare Power of [place];Living will Living will;Healthcare Power of Attorney  Does patient want to make changes to medical advance directive?    No - Patient declined  Copy of Healthcare Power of Attorney in Chart? No - copy requested  No - copy requested No - copy requested  Would patient like information on creating a medical advance directive?  No - Patient declined      Current Medications (verified) Outpatient Encounter Medications as of  11/25/2022  Medication Sig   benazepril (LOTENSIN) 5 MG tablet Take 1.5 tablets (7.5 mg total) by mouth daily. (Patient taking differently: Take 5 mg by mouth daily. Patient states that she only takes 1 tablet)   Calcium-Vitamin D-Vitamin K 500-500-40 MG-UNT-MCG CHEW Chew by mouth. TAKE 2 A DAY   diclofenac sodium (VOLTAREN) 1 % GEL    fish oil-omega-3 fatty acids 1000 MG capsule Take 1 capsule by mouth daily.   Methylsulfonylmethane (MSM PO) Take by mouth.   sertraline (ZOLOFT) 25 MG tablet Take 1 tablet (25 mg total) by mouth daily.   Vitamin D3 (VITAMIN D) 25 MCG tablet Take 1,000 Units by mouth daily.   Nutritional Supplements (ANTI-INFLAMMATORY ENZYME) CAPS APPLY 1 2 GMS TO AFFECTED AREA 3 4 TIMES DAILY AS NEEDED FOR PAIN  THIS IS A COMPOUNDED CREAM 1 PUMP EQUALS 1 GRAM (Patient not taking: Reported on 11/25/2022)   No facility-administered encounter medications on file as of 11/25/2022.    Allergies (verified) Sertraline hcl, Amlodipine, Fosamax [alendronate], Lexapro [escitalopram], Metoprolol, and Telmisartan   History: Past Medical History:  Diagnosis Date   Aneurysm, ophthalmic artery    Colonic polyp    Diverticulosis    Hypertension    Urine incontinence    Past Surgical History:  Procedure Laterality Date   disk removal     History reviewed. No pertinent family history. Social History   Socioeconomic History   Marital status: Widowed    Spouse name: Not on file   Number of children: 3   Years of education: Not on file  Highest education level: Not on file  Occupational History   Occupation: Retired  Tobacco Use   Smoking status: Former   Smokeless tobacco: Former  Building services engineer status: Never Used  Substance and Sexual Activity   Alcohol use: No   Drug use: No   Sexual activity: Not on file  Other Topics Concern   Not on file  Social History Narrative   Lives alone.   Social Determinants of Health   Financial Resource Strain: Low Risk   (11/24/2022)   Overall Financial Resource Strain (CARDIA)    Difficulty of Paying Living Expenses: Not hard at all  Food Insecurity: No Food Insecurity (11/24/2022)   Hunger Vital Sign    Worried About Running Out of Food in the Last Year: Never true    Ran Out of Food in the Last Year: Never true  Transportation Needs: No Transportation Needs (11/24/2022)   PRAPARE - Administrator, Civil Service (Medical): No    Lack of Transportation (Non-Medical): No  Physical Activity: Insufficiently Active (11/24/2022)   Exercise Vital Sign    Days of Exercise per Week: 2 days    Minutes of Exercise per Session: 30 min  Stress: No Stress Concern Present (11/24/2022)   [employer] of Occupational Health - Occupational Stress Questionnaire    Feeling of Stress : Not at all  Social Connections: Unknown (11/24/2022)   Social Connection and Isolation Panel [NHANES]    Frequency of Communication with Friends and Family: More than three times a week    Frequency of Social Gatherings with Friends and Family: Three times a week    Attends Religious Services: Not on file    Active Member of Clubs or Organizations: Yes    Attends Club or Organization Meetings: More than 4 times per year    Marital Status: Widowed    Tobacco Counseling Counseling given: Not Answered   Clinical Intake:  Pre-visit preparation completed: Yes  Pain : No/denies pain     BMI - recorded: 25.7 Nutritional Status: BMI 25 -29 Overweight Nutritional Risks: None Diabetes: No  How often do you need to have someone help you when you read instructions, pamphlets, or other written materials from your doctor or pharmacy?: 2 - Rarely  Interpreter Needed?: No  Information entered by :: Barbara [name], RMA   Activities of Daily Living    11/24/2022    7:45 PM  In your present state of health, do you have any difficulty performing the following activities:  Hearing? 1  Comment wears hearing aides  Vision? 0   Difficulty concentrating or making decisions? 1  Walking or climbing stairs? 0  Dressing or bathing? 0  Doing errands, shopping? 0  Preparing Food and eating ? N  Using the Toilet? N  In the past six months, have you accidently leaked urine? Y  Do you have problems with loss of bowel control? N  Managing your Medications? N  Managing your Finances? N  Housekeeping or managing your Housekeeping? N    Patient Care Team: Pincus Sanes, MD as PCP - General (Internal Medicine) Janalyn Harder, MD (Inactive) as Consulting Physician (Dermatology) Maris Berger, MD as Consulting Physician (Ophthalmology)  Indicate any recent Medical Services you may have received from other than [facility] providers in the past year (date may be approximate).     Assessment:   This is a routine wellness examination for Barbara [name].  Hearing/Vision screen Hearing Screening - Comments:: Wears hearing aides Vision Screening -  Comments:: Wears eyeglasses   Goals Addressed             This Visit's Progress    My goal is to continue to eat healthy, watch my weight and maintain my health.   On track     Depression Screen    11/25/2022   10:45 AM 05/19/2022    9:54 AM 02/17/2022   10:48 AM 11/18/2021    8:43 AM 11/11/2021    9:16 AM 11/01/2021    2:21 PM 11/10/2020    9:01 AM  PHQ 2/9 Scores  PHQ - 2 Score 0 0 0 0 0 0 0  PHQ- 9 Score 2 0 0        Fall Risk    11/24/2022    7:45 PM 05/19/2022    9:54 AM 02/17/2022   10:44 AM 11/18/2021   11:19 AM 11/18/2021    8:43 AM  Fall Risk   Falls in the past year? 1 1 1  0 0  Number falls in past yr: 0 0 0 0 0  Injury with Fall? 1 1 1  0 0  Risk for fall due to :  History of fall(s) No Fall Risks No Fall Risks No Fall Risks  Follow up Falls evaluation completed;Falls prevention discussed Falls evaluation completed Falls evaluation completed Falls evaluation completed Falls evaluation completed    MEDICARE RISK AT HOME: Medicare Risk at Home Any stairs in or  around the home?: Yes If so, are there any without handrails?: No Home free of loose throw rugs in walkways, pet beds, electrical cords, etc?: Yes Adequate lighting in your home to reduce risk of falls?: Yes Life alert?: No Use of a cane, walker or w/c?: No Grab bars in the bathroom?: Yes Shower chair or bench in shower?: No Elevated toilet seat or a handicapped toilet?: No  TIMED UP AND GO:  Was the test performed?  No    Cognitive Function:        11/25/2022   10:43 AM 11/11/2021    9:20 AM  6CIT Screen  What Year? 0 points 0 points  What month? 0 points 0 points  What time? 0 points 0 points  Count back from 20 0 points 0 points  Months in reverse 0 points 0 points  Repeat phrase 2 points 0 points  Total Score 2 points 0 points    Immunizations Immunization History  Administered Date(s) Administered   PFIZER(Purple Top)SARS-COV-2 Vaccination 04/02/2019, 04/22/2019, 12/16/2019   Pneumococcal Conjugate-13 06/22/2016   Pneumococcal Polysaccharide-23 05/31/2004   Td 05/31/2004   Tdap 01/01/2018   Zoster Recombinant(Shingrix) 11/11/2020, 01/15/2021   Zoster, Live 12/12/2012    TDAP status: Up to date  Flu Vaccine status: Declined, Education has been provided regarding the importance of this vaccine but patient still declined. Advised may receive this vaccine at local pharmacy or Health Dept. Aware to provide a copy of the vaccination record if obtained from local pharmacy or Health Dept. Verbalized acceptance and understanding.  Pneumococcal vaccine status: Up to date  Covid-19 vaccine status: Information provided on how to obtain vaccines.   Qualifies for Shingles Vaccine? Yes   Zostavax completed Yes   Shingrix Completed?: Yes  Screening Tests Health Maintenance  Topic Date Due   COVID-19 Vaccine (4 - 2023-24 season) 11/13/2022   Medicare Annual Wellness (AWV)  11/25/2023   DEXA SCAN  05/26/2024   DTaP/Tdap/Td (3 - Td or Tdap) 01/02/2028   Pneumonia Vaccine  22+ Years old  Completed  Zoster Vaccines- Shingrix  Completed   HPV VACCINES  Aged Out   INFLUENZA VACCINE  Discontinued    Health Maintenance  Health Maintenance Due  Topic Date Due   COVID-19 Vaccine (4 - 2023-24 season) 11/13/2022    Colorectal cancer screening: No longer required.   Mammogram status: No longer required due to age.  Bone Density status: Completed 05/27/2022. Results reflect: Bone density results: OSTEOPOROSIS. Repeat every 2 years.  Lung Cancer Screening: (Low Dose CT Chest recommended if Age 57-80 years, 20 pack-year currently smoking OR have quit w/in 15years.) does not qualify.   Lung Cancer Screening Referral: N/A  Additional Screening:  Hepatitis C Screening: does not qualify;   Vision Screening: Recommended annual ophthalmology exams for early detection of glaucoma and other disorders of the eye. Is the patient up to date with their annual eye exam?  Yes  Who is the provider or what is the name of the office in which the patient attends annual eye exams? [facility] If pt is not established with a provider, would they like to be referred to a provider to establish care? No .   Dental Screening: Recommended annual dental exams for proper oral hygiene   Community Resource Referral / Chronic Care Management: CRR required this visit?  No   CCM required this visit?  No     Plan:     I have personally reviewed and noted the following in the patient's chart:   Medical and social history Use of alcohol, tobacco or illicit drugs  Current medications and supplements including opioid prescriptions. Patient is not currently taking opioid prescriptions. Functional ability and status Nutritional status Physical activity Advanced directives List of other physicians Hospitalizations, surgeries, and ER visits in previous 12 months Vitals Screenings to include cognitive, depression, and falls Referrals and appointments  In addition, I have reviewed and  discussed with patient certain preventive protocols, quality metrics, and best practice recommendations. A written personalized care plan for preventive services as well as general preventive health recommendations were provided to patient.     Jhoselyn Ruffini L Wilberth Damon, CMA   11/25/2022   After Visit Summary: (MyChart) Due to this being a telephonic visit, the after visit summary with patients personalized plan was offered to patient via MyChart   Nurse Notes: Patient is up to date on her health maintenance.  She had no concerns to address today.

## 2022-11-25 NOTE — Addendum Note (Signed)
Addended by: Pincus Sanes on: 11/25/2022 01:06 PM   Modules accepted: Orders

## 2022-11-25 NOTE — Patient Instructions (Signed)
Barbara [name] , Thank you for taking time to come for your Medicare Wellness Visit. I appreciate your ongoing commitment to your health goals. Please review the following plan we discussed and let me know if I can assist you in the future.   Referrals/Orders/Follow-Ups/Clinician Recommendations: Keep up the good work.  This is a list of the screening recommended for you and due dates:  Health Maintenance  Topic Date Due   COVID-19 Vaccine (4 - 2023-24 season) 11/13/2022   Medicare Annual Wellness Visit  11/25/2023   DEXA scan (bone density measurement)  05/26/2024   DTaP/Tdap/Td vaccine (3 - Td or Tdap) 01/02/2028   Pneumonia Vaccine  Completed   Zoster (Shingles) Vaccine  Completed   HPV Vaccine  Aged Out   Flu Shot  Discontinued    Advanced directives: (Copy Requested) Please bring a copy of your health care power of attorney and living will to the office to be added to your chart at your convenience.  Next Medicare Annual Wellness Visit scheduled for next year: Yes

## 2022-11-25 NOTE — Telephone Encounter (Signed)
Referral updated, but since it was already sent referrals may need to know was changed.

## 2022-11-25 NOTE — Telephone Encounter (Signed)
Referral was sent to Dr. Perley Jain but it was sent to [facility] instead of [facility] - Please call the [place] office - [phone].

## 2022-12-02 ENCOUNTER — Encounter: Payer: Self-pay | Admitting: Internal Medicine

## 2022-12-21 DIAGNOSIS — H35131 Retinopathy of prematurity, stage 2, right eye: Secondary | ICD-10-CM | POA: Diagnosis not present

## 2022-12-21 DIAGNOSIS — Z961 Presence of intraocular lens: Secondary | ICD-10-CM | POA: Diagnosis not present

## 2022-12-21 DIAGNOSIS — H524 Presbyopia: Secondary | ICD-10-CM | POA: Diagnosis not present

## 2022-12-23 ENCOUNTER — Encounter: Payer: Self-pay | Admitting: Internal Medicine

## 2022-12-23 DIAGNOSIS — R42 Dizziness and giddiness: Secondary | ICD-10-CM

## 2023-01-02 ENCOUNTER — Encounter (INDEPENDENT_AMBULATORY_CARE_PROVIDER_SITE_OTHER): Payer: Self-pay | Admitting: Otolaryngology

## 2023-01-20 ENCOUNTER — Encounter: Payer: Self-pay | Admitting: Internal Medicine

## 2023-04-21 ENCOUNTER — Other Ambulatory Visit: Payer: Self-pay | Admitting: Internal Medicine

## 2023-06-08 ENCOUNTER — Encounter: Payer: Self-pay | Admitting: Internal Medicine

## 2023-06-08 NOTE — Patient Instructions (Addendum)
      Blood work was ordered.       Medications changes include :   None    A referral was ordered and someone will call you to schedule an appointment.     Return in about 6 months (around 12/10/2023) for Physical Exam.

## 2023-06-08 NOTE — Progress Notes (Unsigned)
 Subjective:    Patient ID: Barbara [name], female    DOB: [birth year], 89 y.o.   MRN: [id number]     HPI Lakesha is here for follow up of her chronic medical problems.  Larey Seat 2023- had hematoma.  Since fall has had episodes of feeling thrown back when sitting up in bed.  She denies any lightheadedness or dizziness at that time.   She has occasional has vertigo.  This has gotten better and is mild.  The other day she woke up and sat up in bed and was thrown back in the bed - it was a force that threw her back on the bed.  This only occurs when she sleeps on her right side which she avoids.    Was laying on right side for 20-30 minutes - sat up and sitting on side of bed - she felt like something outside her head  - a force that threw her back laying down.  Had this a few times after the fall in 2023 - then avoiding sleeping on the right side.    Occ sharp transient pain on right side of head - infrequent.  She does have tenderness on the right side of her head where she hit it when she touches those areas.  Larey Seat last week and hit her head.  She had a mechanical fall.      She is concerned because this is happening-she can often avoided, but does not feel that it is and does not understand why it is happening.  Medications and allergies reviewed with patient and updated if appropriate.  Current Outpatient Medications on File Prior to Visit  Medication Sig Dispense Refill   acetaminophen (TYLENOL) 650 MG CR tablet Take 650 mg by mouth daily as needed for pain.     benazepril (LOTENSIN) 5 MG tablet Take 1 tablet (5 mg total) by mouth daily. 30 tablet 1   Calcium-Vitamin D-Vitamin K 500-500-40 MG-UNT-MCG CHEW Chew by mouth. TAKE 2 A DAY     diclofenac sodium (VOLTAREN) 1 % GEL      fish oil-omega-3 fatty acids 1000 MG capsule Take 1 capsule by mouth daily.     Methylsulfonylmethane (MSM PO) Take by mouth.     sertraline (ZOLOFT) 25 MG tablet Take 1 tablet (25 mg total) by mouth  daily. 90 tablet 3   Vitamin D3 (VITAMIN D) 25 MCG tablet Take 1,000 Units by mouth daily.     No current facility-administered medications on file prior to visit.     Review of Systems  Constitutional:  Negative for fever.  Eyes:  Negative for visual disturbance.  Respiratory:  Negative for cough, shortness of breath and wheezing.   Cardiovascular:  Negative for chest pain, palpitations and leg swelling.  Neurological:  Positive for dizziness (occ - mild). Negative for weakness, light-headedness, numbness and headaches (occ head pain - sharp transient nerve pain on right side).       Objective:   Vitals:   06/09/23 1019  BP: (!) 140/78  Pulse: 68  Temp: 97.9 F (36.6 C)  SpO2: 98%   BP Readings from Last 3 Encounters:  06/09/23 (!) 140/78  11/22/22 130/74  05/19/22 124/72   Wt Readings from Last 3 Encounters:  06/09/23 141 lb (64 kg)  11/25/22 136 lb (61.7 kg)  11/22/22 140 lb 6.4 oz (63.7 kg)   Body mass index is 26.64 kg/m.    Physical Exam Constitutional:      General: She  is not in acute distress.    Appearance: Normal appearance.  HENT:     Head: Normocephalic and atraumatic.  Eyes:     Conjunctiva/sclera: Conjunctivae normal.  Cardiovascular:     Rate and Rhythm: Normal rate and regular rhythm.     Heart sounds: Normal heart sounds.  Pulmonary:     Effort: Pulmonary effort is normal. No respiratory distress.     Breath sounds: Normal breath sounds. No wheezing.  Musculoskeletal:     Cervical back: Neck supple.     Right lower leg: No edema.     Left lower leg: No edema.  Lymphadenopathy:     Cervical: No cervical adenopathy.  Skin:    General: Skin is warm and dry.     Findings: No rash.  Neurological:     General: No focal deficit present.     Mental Status: She is alert. Mental status is at baseline.  Psychiatric:        Mood and Affect: Mood normal.        Behavior: Behavior normal.        Lab Results  Component Value Date   WBC 5.0  05/19/2022   HGB 13.5 05/19/2022   HCT 41.5 05/19/2022   PLT 165.0 05/19/2022   GLUCOSE 78 05/19/2022   CHOL 197 11/11/2020   TRIG 127.0 11/11/2020   HDL 79.00 11/11/2020   LDLCALC 93 11/11/2020   ALT 11 05/19/2022   AST 16 05/19/2022   NA 140 05/19/2022   K 5.2 No hemolysis seen (H) 05/19/2022   CL 103 05/19/2022   CREATININE 1.13 05/19/2022   BUN 29 (H) 05/19/2022   CO2 31 05/19/2022   TSH 1.30 12/11/2019   HGBA1C 5.7 05/19/2022     Assessment & Plan:    See Problem List for Assessment and Plan of chronic medical problems.

## 2023-06-09 ENCOUNTER — Encounter: Payer: Self-pay | Admitting: Internal Medicine

## 2023-06-09 ENCOUNTER — Ambulatory Visit (INDEPENDENT_AMBULATORY_CARE_PROVIDER_SITE_OTHER): Admitting: Internal Medicine

## 2023-06-09 VITALS — BP 140/78 | HR 68 | Temp 97.9°F | Ht 61.0 in | Wt 141.0 lb

## 2023-06-09 DIAGNOSIS — N1831 Chronic kidney disease, stage 3a: Secondary | ICD-10-CM

## 2023-06-09 DIAGNOSIS — M810 Age-related osteoporosis without current pathological fracture: Secondary | ICD-10-CM | POA: Diagnosis not present

## 2023-06-09 DIAGNOSIS — F419 Anxiety disorder, unspecified: Secondary | ICD-10-CM

## 2023-06-09 DIAGNOSIS — R7303 Prediabetes: Secondary | ICD-10-CM | POA: Diagnosis not present

## 2023-06-09 DIAGNOSIS — I700 Atherosclerosis of aorta: Secondary | ICD-10-CM | POA: Diagnosis not present

## 2023-06-09 DIAGNOSIS — R42 Dizziness and giddiness: Secondary | ICD-10-CM

## 2023-06-09 DIAGNOSIS — I10 Essential (primary) hypertension: Secondary | ICD-10-CM

## 2023-06-09 DIAGNOSIS — R2689 Other abnormalities of gait and mobility: Secondary | ICD-10-CM | POA: Insufficient documentation

## 2023-06-09 DIAGNOSIS — I69398 Other sequelae of cerebral infarction: Secondary | ICD-10-CM | POA: Insufficient documentation

## 2023-06-09 LAB — CBC WITH DIFFERENTIAL/PLATELET
Basophils Absolute: 0 10*3/uL (ref 0.0–0.1)
Basophils Relative: 0.7 % (ref 0.0–3.0)
Eosinophils Absolute: 0.1 10*3/uL (ref 0.0–0.7)
Eosinophils Relative: 2.3 % (ref 0.0–5.0)
HCT: 42 % (ref 36.0–46.0)
Hemoglobin: 13.9 g/dL (ref 12.0–15.0)
Lymphocytes Relative: 21.2 % (ref 12.0–46.0)
Lymphs Abs: 1.3 10*3/uL (ref 0.7–4.0)
MCHC: 33 g/dL (ref 30.0–36.0)
MCV: 93.6 fl (ref 78.0–100.0)
Monocytes Absolute: 0.6 10*3/uL (ref 0.1–1.0)
Monocytes Relative: 9.2 % (ref 3.0–12.0)
Neutro Abs: 4.1 10*3/uL (ref 1.4–7.7)
Neutrophils Relative %: 66.6 % (ref 43.0–77.0)
Platelets: 166 10*3/uL (ref 150.0–400.0)
RBC: 4.49 Mil/uL (ref 3.87–5.11)
RDW: 13.7 % (ref 11.5–15.5)
WBC: 6.1 10*3/uL (ref 4.0–10.5)

## 2023-06-09 LAB — COMPREHENSIVE METABOLIC PANEL WITH GFR
ALT: 10 U/L (ref 0–35)
AST: 15 U/L (ref 0–37)
Albumin: 4.2 g/dL (ref 3.5–5.2)
Alkaline Phosphatase: 84 U/L (ref 39–117)
BUN: 29 mg/dL — ABNORMAL HIGH (ref 6–23)
CO2: 31 meq/L (ref 19–32)
Calcium: 10.1 mg/dL (ref 8.4–10.5)
Chloride: 103 meq/L (ref 96–112)
Creatinine, Ser: 1.13 mg/dL (ref 0.40–1.20)
GFR: 43.47 mL/min — ABNORMAL LOW (ref >60.00)
Glucose, Bld: 75 mg/dL (ref 70–99)
Potassium: 4.4 meq/L (ref 3.5–5.1)
Sodium: 142 meq/L (ref 135–145)
Total Bilirubin: 0.9 mg/dL (ref 0.2–1.2)
Total Protein: 7.2 g/dL (ref 6.0–8.3)

## 2023-06-09 LAB — LIPID PANEL
Cholesterol: 204 mg/dL — ABNORMAL HIGH (ref 0–200)
HDL: 79.1 mg/dL (ref >39.00)
LDL Cholesterol: 96 mg/dL (ref 0–99)
NonHDL: 124.99
Total CHOL/HDL Ratio: 3
Triglycerides: 147 mg/dL (ref 0.0–149.0)
VLDL: 29.4 mg/dL (ref 0.0–40.0)

## 2023-06-09 LAB — VITAMIN D 25 HYDROXY (VIT D DEFICIENCY, FRACTURES): VITD: 54.93 ng/mL (ref 30.00–100.00)

## 2023-06-09 NOTE — Assessment & Plan Note (Addendum)
 Chronic Occasional Mild-has improved  She is having episodes that are not true vertigo and is difficult to know exactly what they are.  They have occurred since her fall in 2023.  They only occur after laying on the right side of her head.  When she sits up in bed she feels a force throws her back onto the bed-denies any true lightheadedness or dizziness during this time.  Happened recently after laying on the right side of her head I cannot explain this and I am not exactly sure what imaging to order so will refer to neurology for their input

## 2023-06-09 NOTE — Assessment & Plan Note (Signed)
 States her balance has gotten worse She is doing some exercise Encouraged balance exercises Fall precaution Discussed that I can refer her to PT to help with her balance and strength-she deferred, but will let me know if she would like to be referred

## 2023-06-09 NOTE — Assessment & Plan Note (Signed)
Chronic Lab Results  Component Value Date   LDLCALC 93 11/11/2020    deferred statin Encouraged regular exercise, healthy diet

## 2023-06-09 NOTE — Assessment & Plan Note (Signed)
 Chronic Blood pressure has been well-controlled at home and is very well-controlled here today cmp Continue benazepril 7.5 mg daily

## 2023-06-09 NOTE — Assessment & Plan Note (Signed)
Chronic Check a1c Low sugar / carb diet Stressed regular exercise  

## 2023-06-09 NOTE — Assessment & Plan Note (Signed)
Chronic Controlled, Stable Continue sertraline 25 mg daily 

## 2023-06-09 NOTE — Assessment & Plan Note (Signed)
 Chronic DEXA up to date Continue calcium, vitamin D daily Encourage as much activity as possible Did not tolerate Fosamax in the past Check vitamin D level Recommended Prolia

## 2023-06-09 NOTE — Assessment & Plan Note (Signed)
Chronic Kidney function has been very stable Blood pressure well-controlled CMP, CBC

## 2023-06-10 ENCOUNTER — Encounter: Payer: Self-pay | Admitting: Internal Medicine

## 2023-06-10 LAB — HEMOGLOBIN A1C: Hgb A1c MFr Bld: 5.6 % (ref 4.6–6.5)

## 2023-06-15 ENCOUNTER — Encounter: Payer: Self-pay | Admitting: Neurology

## 2023-06-21 ENCOUNTER — Encounter: Payer: Self-pay | Admitting: Internal Medicine

## 2023-07-05 ENCOUNTER — Other Ambulatory Visit: Payer: Self-pay | Admitting: Internal Medicine

## 2023-07-07 ENCOUNTER — Encounter: Payer: Self-pay | Admitting: Internal Medicine

## 2023-07-10 ENCOUNTER — Other Ambulatory Visit: Payer: Self-pay

## 2023-07-10 ENCOUNTER — Other Ambulatory Visit (INDEPENDENT_AMBULATORY_CARE_PROVIDER_SITE_OTHER)

## 2023-07-10 ENCOUNTER — Ambulatory Visit (INDEPENDENT_AMBULATORY_CARE_PROVIDER_SITE_OTHER): Admitting: Orthopaedic Surgery

## 2023-07-10 DIAGNOSIS — G8929 Other chronic pain: Secondary | ICD-10-CM

## 2023-07-10 DIAGNOSIS — M25561 Pain in right knee: Secondary | ICD-10-CM | POA: Diagnosis not present

## 2023-07-10 MED ORDER — BENAZEPRIL HCL 5 MG PO TABS: 5.0000 mg | ORAL_TABLET | Freq: Every day | ORAL | 1 refills | Status: DC

## 2023-07-10 NOTE — Progress Notes (Signed)
 The patient is a very active and pleasant 89 year old female who fell injuring her left knee and really anterior shin at the beginning of March of this year.  She said the first day she did not really hurt better but a couple days later she started hurting quite a bit but she was able to walk it off.  She still hurting a little bit into her shin is where she points to but overall seems to be getting better.  Over 6 weeks since this happened.  She gets up on the exam table easily and gets up easily from a seated position.  Her right knee has full range of motion in his legs be stable.  There is no effusion.  There is no significant pain or bruising and no pain to palpation down the tibial spine in the shin.  Her hip exam is normal and she has negative straight leg raise.  There is some patellofemoral crepitation.  2 views of the right knee show no acute findings.  There is significant patellofemoral arthritis but otherwise normal alignment and no effusion and no evidence of fracture.  Since she is doing better overall this can still be treated conservatively with just Tylenol or Advil or even nothing depending on how she is doing.  If things worsen anyway she knows to let us  know.

## 2023-07-20 NOTE — Progress Notes (Signed)
 Initial neurology clinic note  Reason for Evaluation: Consultation requested by Colene Dauphin, MD for an opinion regarding dizziness. My final recommendations will be communicated back to the requesting physician by way of shared medical record or letter to requesting physician via [place]  mail.  HPI: This is [name], a 89 y.o. left-handed female with a medical history of HTN, GCA, BPPV, degenerative disc disease of spine who presents to neurology clinic with the chief complaint of dizziness. The patient is accompanied by daughter.  Patient had a fall in 2023. She had vertigo in the beginning. This has lessened over time. She has to sleep on her left side. If she sleeps on her right side then sits up, it feels like something is throwing her backward. She can have some nausea. She will rest for 5-10 minutes and symptoms will pass. She also mentions an occasional noise in her ear, like at a distance. She denies it being ringing in her ear.  She denies significant headaches. She does have a history of temporal arteritis. She has been on steroids in the past. She denies temporal pain, vision loss, or fevers or unintentional weight loss. She does have occasional jaw pain.  She also mentions laying on her back and reading, she can turn her head and feel room spinning. It will last a minute or so then resolve.  She does not have significant dizziness when she is not laying down. She had one other fall since 2023 when she tripped over a curb after parking her car (05/2023).  She saw audiologist in 12/08/22. Vestibular rehab was mentioned, but this was not done. She did not have any vestibular testing. She also endorses hearing loss for at least 3 years.  She has degenerative disc disease in neck but denies significant neck pain.  She does think her balance is a little worse than previous. She wonders if this is due to sertraline  that she has been taking 1-2 years.  EtOH use: none  Restrictive  diet? no Family history of neurologic disease? No  MEDICATIONS:  Outpatient Encounter Medications as of 08/02/2023  Medication Sig   acetaminophen (TYLENOL) 650 MG CR tablet Take 650 mg by mouth daily as needed for pain.   benazepril  (LOTENSIN ) 5 MG tablet Take 1 tablet (5 mg total) by mouth daily.   Calcium-Vitamin D -Vitamin K 500-500-40 MG-UNT-MCG CHEW Chew by mouth. TAKE 2 A DAY   diclofenac sodium (VOLTAREN) 1 % GEL as needed.   fish oil-omega-3 fatty acids 1000 MG capsule Take 1 capsule by mouth daily.   Methylsulfonylmethane (MSM PO) Take by mouth.   sertraline  (ZOLOFT ) 25 MG tablet Take 1 tablet (25 mg total) by mouth daily.   Vitamin D3 (VITAMIN D ) 25 MCG tablet Take 1,000 Units by mouth daily.   No facility-administered encounter medications on file as of 08/02/2023.    PAST MEDICAL HISTORY: Past Medical History:  Diagnosis Date   Aneurysm, ophthalmic artery    Colonic polyp    Diverticulosis    Hypertension    Urine incontinence     PAST SURGICAL HISTORY: Past Surgical History:  Procedure Laterality Date   disk removal      ALLERGIES: Allergies  Allergen Reactions   Sertraline  Hcl     Makes her too drowsy   Amlodipine  Swelling    Swelling at 5 mg   Fosamax [Alendronate]     gerd   Lexapro  [Escitalopram ] Itching    Itching, not effective   Metoprolol  Other (See  Comments)    Felt off balance   Telmisartan  Other (See Comments)    jitteriness    FAMILY HISTORY: History reviewed. No pertinent family history.  SOCIAL HISTORY: Social History   Tobacco Use   Smoking status: Former   Smokeless tobacco: Former  Building services engineer status: Never Used  Substance Use Topics   Alcohol use: No   Drug use: No   Social History   Social History Narrative   Lives alone.   Are you right handed or left handed? left   Are you currently employed ?    What is your current occupation? retired   Do you live at home alone? yes   Who lives with you?    What type  of home do you live in: 1 story or 2 story? one    Caffiene 1 tea a day     OBJECTIVE: PHYSICAL EXAM: BP (!) 160/71   Pulse 74   Ht 5\' 1"  (1.549 m)   Wt 140 lb (63.5 kg)   SpO2 96%   BMI 26.45 kg/m   General: General appearance: Awake and alert. No distress. Cooperative with exam.  Skin: No obvious rash or jaundice. HEENT: Atraumatic. Anicteric. Lungs: Non-labored breathing on room air  Heart: Regular Extremities: No edema. Psych: Affect appropriate.  Neurological: Mental Status: Alert. Speech fluent. No pseudobulbar affect Cranial Nerves: CNII: No RAPD. Visual fields grossly intact. CNIII, IV, VI: PERRL. No nystagmus. EOMI. CN V: Facial sensation intact bilaterally to fine touch. CN VII: Facial muscles symmetric and strong. No ptosis at rest. CN VIII: Hearing grossly intact bilaterally. CN IX: No hypophonia. CN X: Palate elevates symmetrically. CN XI: Full strength shoulder shrug bilaterally. CN XII: Tongue protrusion full and midline. No atrophy or fasciculations. No significant dysarthria Motor: Tone: paratonia. Strength 5/5 in bilateral upper and lower extremities. Reflexes:  Right Left   Bicep 2+ 2+   Tricep 2+ 2+   BrRad 2+ 2+   Knee 2+ 2+   Ankle Trace Trace    Sensation: Pinprick: Intact in all extremities Vibration: Intact in all extremities Coordination: Intact finger-to- nose-finger bilaterally. Romberg negative. Gait: Able to rise from chair with arms crossed unassisted. Stooped posture. Antalgic gait.  Lab and Test Review: Internal labs: 06/09/23: CBC w/ diff unremarkable CMP significant for BUN 29, Cr 1.13 Lipid panel: tChol 204, LDL 96, TG 147 HbA1c: 5.6 Vit D wnl  TSH (12/11/19) wnl  Imaging/Procedures: CT head wo contrast (02/09/22): FINDINGS: Brain: Mild to moderate generalized atrophy. Moderate white matter hypodensity bilaterally is symmetric and likely chronic.   Negative for acute infarct, intracranial hemorrhage, mass    Vascular: Negative for hyperdense vessel   Skull: Negative   Sinuses/Orbits: Paranasal sinuses clear. Bilateral cataract extraction.   Other: Large scalp hematoma right parietal region.   IMPRESSION: 1. No acute intracranial abnormality. Atrophy and chronic microvascular ischemia. 2. Large scalp hematoma right parietal region.  ASSESSMENT: Barbara [name] is a 89 y.o. female who presents for evaluation of abnormal sensation of being pulled down after laying on right side. She has a relevant medical history of HTN, GCA, BPPV, degenerative disc disease of spine, and hearing loss. Her neurological examination is essentially normal. I was unable to provoke dizziness or abnormal sensation patient describes in clinic today. The etiology of patient's symptoms is unclear. Given that it only occurs when laying on right side then trying to sit up, this could be vestibular system related. I do not see any clear evidence of  other neurologic process. I will check labs to look for treatable causes and send to vestibular rehab (as also suggested previously by audiology). If this does not help, she may need further testing, such as vestibular testing.  PLAN: -Blood work: ESR, CRP, B1, B12 -Vestibular rehab  -Return to clinic to be determined  The impression above as well as the plan as outlined below were extensively discussed with the patient (in the company of daughter) who voiced understanding. All questions were answered to their satisfaction.  The patient was counseled on pertinent fall precautions per the printed material provided today, and as noted under the "Patient Instructions" section below.  When available, results of the above investigations and possible further recommendations will be communicated to the patient via telephone/MyChart. Patient to call office if not contacted after expected testing turnaround time.   Total time spent reviewing records, interview, history/exam,  documentation, and coordination of care on day of encounter:  65 min   Thank you for allowing me to participate in patient's care.  If I can answer any additional questions, I would be pleased to do so.  Rommie Coats, MD   CC: Colene Dauphin, MD [address] [place] [address]  CC: Referring provider: Colene Dauphin, MD [address] [place],  [place] [address]

## 2023-07-23 ENCOUNTER — Encounter: Payer: Self-pay | Admitting: Orthopaedic Surgery

## 2023-07-25 ENCOUNTER — Emergency Department (HOSPITAL_BASED_OUTPATIENT_CLINIC_OR_DEPARTMENT_OTHER)
Admission: EM | Admit: 2023-07-25 | Discharge: 2023-07-25 | Disposition: A | Discharge status: Home / Self Care | Attending: Emergency Medicine | Admitting: Emergency Medicine

## 2023-07-25 ENCOUNTER — Other Ambulatory Visit: Payer: Self-pay

## 2023-07-25 DIAGNOSIS — W268XXA Contact with other sharp object(s), not elsewhere classified, initial encounter: Secondary | ICD-10-CM | POA: Insufficient documentation

## 2023-07-25 DIAGNOSIS — I10 Essential (primary) hypertension: Secondary | ICD-10-CM | POA: Diagnosis not present

## 2023-07-25 DIAGNOSIS — S51811A Laceration without foreign body of right forearm, initial encounter: Secondary | ICD-10-CM | POA: Insufficient documentation

## 2023-07-25 DIAGNOSIS — Z23 Encounter for immunization: Secondary | ICD-10-CM | POA: Diagnosis not present

## 2023-07-25 DIAGNOSIS — S59911A Unspecified injury of right forearm, initial encounter: Secondary | ICD-10-CM | POA: Diagnosis not present

## 2023-07-25 MED ORDER — TETANUS-DIPHTH-ACELL PERTUSSIS 5-2.5-18.5 LF-MCG/0.5 IM SUSY
0.5000 mL | PREFILLED_SYRINGE | Freq: Once | INTRAMUSCULAR | Status: AC
  Administered 2023-07-25: 0.5 mL via INTRAMUSCULAR
  Filled 2023-07-25: qty 0.5

## 2023-07-25 NOTE — ED Provider Notes (Cosign Needed Addendum)
 [place] EMERGENCY DEPARTMENT AT [facility] Provider Note   CSN: [id number] Arrival date & time: 07/25/23  1501     History  Chief Complaint  Patient presents with   Laceration    Barbara [name] is a 89 y.o. female.  She reports history of arthritis and hypertension.  She presents the ER for evaluation of a skin tear to the right forearm.  She was outside emptying a grain filled wagon and she states of metal frame come down and struck her on the left forearm.  She was wearing long sleeves but states the skin tore underneath.  Last tetanus shot was in 2019, she denies significant pain, deformity, numbness or tingling, or limited range of motion. Denies head injury or other complaints.  Laceration      Home Medications Prior to Admission medications   Medication Sig Start Date End Date Taking? Authorizing Provider  acetaminophen (TYLENOL) 650 MG CR tablet Take 650 mg by mouth daily as needed for pain.    [provider]  benazepril  (LOTENSIN ) 5 MG tablet Take 1 tablet (5 mg total) by mouth daily. 07/10/23   Colene Dauphin, MD  Calcium-Vitamin D -Vitamin K 500-500-40 MG-UNT-MCG CHEW Chew by mouth. TAKE 2 A DAY    [provider]  diclofenac sodium (VOLTAREN) 1 % GEL  07/18/18   [provider]  fish oil-omega-3 fatty acids 1000 MG capsule Take 1 capsule by mouth daily.    [provider]  Methylsulfonylmethane (MSM PO) Take by mouth.    [provider]  sertraline  (ZOLOFT ) 25 MG tablet Take 1 tablet (25 mg total) by mouth daily. 11/24/22   Colene Dauphin, MD  Vitamin D3 (VITAMIN D ) 25 MCG tablet Take 1,000 Units by mouth daily.    [provider]      Allergies    Sertraline  hcl, Amlodipine , Fosamax [alendronate], Lexapro  [escitalopram ], Metoprolol , and Telmisartan     Review of Systems   Review of Systems  Physical Exam Updated Vital Signs BP (!) 149/69 (BP Location: Right Arm)   Pulse 71   Resp 18   Ht 5\' 1"   (1.549 m)   Wt 61.7 kg   SpO2 99%   BMI 25.70 kg/m  Physical Exam Vitals and nursing note reviewed.  Constitutional:      General: She is not in acute distress.    Appearance: She is well-developed.  HENT:     Head: Normocephalic and atraumatic.     Mouth/Throat:     Mouth: Mucous membranes are moist.  Eyes:     Conjunctiva/sclera: Conjunctivae normal.  Cardiovascular:     Rate and Rhythm: Normal rate and regular rhythm.     Heart sounds: No murmur heard. Pulmonary:     Effort: Pulmonary effort is normal. No respiratory distress.     Breath sounds: Normal breath sounds.  Abdominal:     Palpations: Abdomen is soft.     Tenderness: There is no abdominal tenderness.  Musculoskeletal:        General: No swelling.     Cervical back: Neck supple.     Comments: Motion of right wrist elbow and shoulder.  Right radial pulse intact.  Sensation intact.  No swelling deformity to right upper extremity.  No bony tenderness.  Skin:    General: Skin is warm and dry.     Capillary Refill: Capillary refill takes less than 2 seconds.     Comments: Approximately 7 cm L-shaped superficial skin tear with minimal gaping to  the dorsal aspect of the right distal forearm  Neurological:     General: No focal deficit present.     Mental Status: She is alert and oriented to person, place, and time.     Sensory: No sensory deficit.     Motor: No weakness.  Psychiatric:        Mood and Affect: Mood normal.     ED Results / Procedures / Treatments   Labs (all labs ordered are listed, but only abnormal results are displayed) Labs Reviewed - No data to display  EKG None  Radiology No results found.  Procedures .Laceration Repair  Date/Time: 07/25/2023 3:52 PM  Performed by: Aimee Houseman, PA-C Authorized by: Aimee Houseman, PA-C   Consent:    Consent obtained:  Verbal   Consent given by:  Patient   Risks discussed:  Infection, poor wound healing and pain Universal protocol:     Patient identity confirmed:  Verbally with patient Anesthesia:    Anesthesia method:  None Laceration details:    Location: Dorsal aspect of right distal forearm.   Length (cm):  7 Exploration:    Hemostasis achieved with:  Direct pressure   Wound exploration: wound explored through full range of motion and entire depth of wound visualized     Wound extent: areolar tissue not violated, no signs of injury and no vascular damage     Contaminated: no   Treatment:    Area cleansed with:  Saline   Amount of cleaning:  Standard   Irrigation solution:  Sterile saline   Irrigation method:  Pressure wash   Debridement:  None Skin repair:    Repair method:  Steri-Strips   Number of Steri-Strips:  7 Approximation:    Approximation:  Close Repair type:    Repair type:  Simple Post-procedure details:    Dressing: 4X4s.   Procedure completion:  Tolerated well, no immediate complications     Medications Ordered in ED Medications  Tdap (BOOSTRIX) injection 0.5 mL (0.5 mLs Intramuscular Given 07/25/23 1551)    ED Course/ Medical Decision Making/ A&P                                 Medical Decision Making Differential diagnosis includes but not limited to laceration, contusion, abrasion, fracture, other  ED course: Patient has a superficial abrasion, had an object fall on her arm and tender the skin through her clothing today.  She has no significant tenderness, she has normal range of motion of the wrist and elbow.  No bony tenderness.  She can pronate and supinate the forearm without difficulty.  There is no deformity.  Her right radial pulse is intact.  We updated her tetanus, repair the very superficial laceration with Steri-Strips after thorough reading.  Advised on wound care follow-up and strict return precautions.  Patient noted to have asymptomatic hypertension, advised on follow-up close with PCP, no concerning symptoms such as chest pain, shortness of breath, severe headache,  numbness tingling or weakness, dizziness or other worrisome symptoms.  Re Checked blood pressure was much lower.  Risk Prescription drug management.           Final Clinical Impression(s) / ED Diagnoses Final diagnoses:  Skin tear of forearm without complication, right, initial encounter    Rx / DC Orders ED Discharge Orders     None         Anders Hohmann A, PA-C  07/25/23 1557    Baxter Limber A, PA-C 07/25/23 1600    Deatra Face, MD 07/26/23 1328    Deatra Face, MD 07/26/23 1330

## 2023-07-25 NOTE — ED Triage Notes (Signed)
 Pt POV after metal frame fell on arm in garage, skin tear R forearm, bleeding controlled, wrapped with guaze and coban in triage.

## 2023-07-25 NOTE — Discharge Instructions (Addendum)
 It was a pleasure taking care of you today.  You are seen in the ER for a skin tear to your forearm.  We were able to repair this using Steri-Strips.  Keep this clean and dry.  Do not get it wet for at least 12 hours.  After that you can shower, wash it gently with soap and water and pat to dry.  The Steri-Strips will peel off on their own in about a week.  Avoid peeling or picking them off.  If you get redness, swelling, pain, drainage or other worrisome changes come back to the ER right away.  Otherwise follow-up closely with PCP for a wound check.  Your blood pressure was also elevated in the ER.  Please follow-up with your primary care doctor for a recheck.  Come back if you have any severe symptoms such as a very bad headache, chest pain, trouble breathing, numbness, tingling, or weakness.

## 2023-07-27 ENCOUNTER — Ambulatory Visit: Admitting: Nurse Practitioner

## 2023-07-27 VITALS — BP 116/70 | HR 71 | Temp 98.4°F | Ht 61.0 in | Wt 140.0 lb

## 2023-07-27 DIAGNOSIS — S41111D Laceration without foreign body of right upper arm, subsequent encounter: Secondary | ICD-10-CM

## 2023-07-27 DIAGNOSIS — S41111A Laceration without foreign body of right upper arm, initial encounter: Secondary | ICD-10-CM | POA: Insufficient documentation

## 2023-07-27 NOTE — Assessment & Plan Note (Signed)
 Acute Skin tear is well-approximated, no bleeding, Steri-Strips in place. Patient is concerned Steri-Strips might get caught on bedding at night and may pull or cause additional tear or bleeding.  Recommend wrapping with nonadherent dressing and Coban at night to provide additional protection.  Supplies provided to patient.  Can wear long sleeve during the day. No evidence of secondary infection. Patient encouraged to monitor, she likely could shower while trying to keep arm as dry as possible, she should continue to avoid submerging skin tear under water (thus avoiding tub baths).  Return to clinic if signs of infection occur or if bleeding/further tearing occurs.

## 2023-07-27 NOTE — Progress Notes (Signed)
   Established Patient Office Visit  Subjective   Patient ID: Barbara [name], female    DOB: Jun 03, 1932  Age: 89 y.o. MRN: [id number]  Chief Complaint  Patient presents with   Wound Check    Patient seen in the Emergency Department 2 days ago after she accidentally cut her right forearm while working with a metal wheelbarrow.  She reports that her pain is well-managed.  She has Steri-Strips in place, has not had any discharge from the wound nor any additional bleeding.  She is keeping the area dry.  No new swelling.  She did get a tetanus booster while in the emergency department.    ROS: see HPI    Objective:     BP 116/70   Pulse 71   Temp 98.4 F (36.9 C) (Temporal)   Ht 5\' 1"  (1.549 m)   Wt 140 lb (63.5 kg)   SpO2 99%   BMI 26.45 kg/m    Physical Exam Vitals reviewed.  Constitutional:      General: She is not in acute distress.    Appearance: Normal appearance.  HENT:     Head: Normocephalic and atraumatic.  Neck:     Vascular: No carotid bruit.  Cardiovascular:     Rate and Rhythm: Normal rate and regular rhythm.     Pulses: Normal pulses.     Heart sounds: Normal heart sounds.  Pulmonary:     Effort: Pulmonary effort is normal.     Breath sounds: Normal breath sounds.  Skin:    General: Skin is warm and dry.       Neurological:     General: No focal deficit present.     Mental Status: She is alert and oriented to person, place, and time.  Psychiatric:        Mood and Affect: Mood normal.        Behavior: Behavior normal.        Judgment: Judgment normal.      No results found for any visits on 07/27/23.    The ASCVD Risk score (Arnett DK, et al., 2019) failed to calculate for the following reasons:   The 2019 ASCVD risk score is only valid for ages 51 to 55    Assessment & Plan:   Problem List Items Addressed This Visit       Musculoskeletal and Integument   Skin tear of right upper arm without complication - Primary   Acute Skin tear  is well-approximated, no bleeding, Steri-Strips in place. Patient is concerned Steri-Strips might get caught on bedding at night and may pull or cause additional tear or bleeding.  Recommend wrapping with nonadherent dressing and Coban at night to provide additional protection.  Supplies provided to patient.  Can wear long sleeve during the day. No evidence of secondary infection. Patient encouraged to monitor, she likely could shower while trying to keep arm as dry as possible, she should continue to avoid submerging skin tear under water (thus avoiding tub baths).  Return to clinic if signs of infection occur or if bleeding/further tearing occurs.       Return if symptoms worsen or fail to improve.    Zorita Hiss, NP

## 2023-08-02 ENCOUNTER — Ambulatory Visit: Admitting: Neurology

## 2023-08-02 ENCOUNTER — Other Ambulatory Visit

## 2023-08-02 ENCOUNTER — Encounter: Payer: Self-pay | Admitting: Neurology

## 2023-08-02 VITALS — BP 160/71 | HR 74 | Ht 61.0 in | Wt 140.0 lb

## 2023-08-02 DIAGNOSIS — H903 Sensorineural hearing loss, bilateral: Secondary | ICD-10-CM | POA: Diagnosis not present

## 2023-08-02 DIAGNOSIS — W19XXXS Unspecified fall, sequela: Secondary | ICD-10-CM

## 2023-08-02 DIAGNOSIS — R42 Dizziness and giddiness: Secondary | ICD-10-CM | POA: Diagnosis not present

## 2023-08-02 NOTE — Patient Instructions (Signed)
 I saw you today for the odd pulling down sensation after laying on your right side. I am not sure the cause of your symptoms but suspect this may be related to a vestibular or inner ear problem.  I would like to get lab work today. I will be in touch when I have the results.  I would also like to refer you to vestibular therapy to help with symptoms.  If this does not help, we may have to consider other options like evaluation by an ear, nose, and throat doctor or do other testing.  Please let me know if you have any questions or concerns in the meantime.  The physicians and staff at [facility] are committed to providing excellent care. You may receive a survey requesting feedback about your experience at our office. We strive to receive "very good" responses to the survey questions. If you feel that your experience would prevent you from giving the office a "very good " response, please contact our office to try to remedy the situation. We may be reached at [phone]. Thank you for taking the time out of your busy day to complete the survey.  Barbara Coats, MD [facility]  Preventing Falls at [facility] are common, often dreaded events in the lives of older people. Aside from the obvious injuries and even death that may result, fall can cause wide-ranging consequences including loss of independence, mental decline, decreased activity and mobility. Younger people are also at risk of falling, especially those with chronic illnesses and fatigue.  Ways to reduce risk for falling Examine diet and medications. Warm foods and alcohol dilate blood vessels, which can lead to dizziness when standing. Sleep aids, antidepressants and pain medications can also increase the likelihood of a fall.  Get a vision exam. Poor vision, cataracts and glaucoma increase the chances of falling.  Check foot gear. Shoes should fit snugly and have a sturdy, nonskid sole and a broad, low  heel  Participate in a physician-approved exercise program to build and maintain muscle strength and improve balance and coordination. Programs that use ankle weights or stretch bands are excellent for muscle-strengthening. Water aerobics programs and low-impact Tai Chi programs have also been shown to improve balance and coordination.  Increase vitamin D  intake. Vitamin D  improves muscle strength and increases the amount of calcium the body is able to absorb and deposit in bones.  How to prevent falls from common hazards Floors - Remove all loose wires, cords, and throw rugs. Minimize clutter. Make sure rugs are anchored and smooth. Keep furniture in its usual place.  Chairs -- Use chairs with straight backs, armrests and firm seats. Add firm cushions to existing pieces to add height.  Bathroom - Install grab bars and non-skid tape in the tub or shower. Use a bathtub transfer bench or a shower chair with a back support Use an elevated toilet seat and/or safety rails to assist standing from a low surface. Do not use towel racks or bathroom tissue holders to help you stand.  Lighting - Make sure halls, stairways, and entrances are well-lit. Install a night light in your bathroom or hallway. Make sure there is a light switch at the top and bottom of the staircase. Turn lights on if you get up in the middle of the night. Make sure lamps or light switches are within reach of the bed if you have to get up during the night.  Kitchen - Install non-skid rubber mats near the sink  and stove. Clean spills immediately. Store frequently used utensils, pots, pans between waist and eye level. This helps prevent reaching and bending. Sit when getting things out of lower cupboards.  Living room/ Bedrooms - Place furniture with wide spaces in between, giving enough room to move around. Establish a route through the living room that gives you something to hold onto as you walk.  Stairs - Make sure treads, rails, and  rugs are secure. Install a rail on both sides of the stairs. If stairs are a threat, it might be helpful to arrange most of your activities on the lower level to reduce the number of times you must climb the stairs.  Entrances and doorways - Install metal handles on the walls adjacent to the doorknobs of all doors to make it more secure as you travel through the doorway.  Tips for maintaining balance Keep at least one hand free at all times. Try using a backpack or fanny pack to hold things rather than carrying them in your hands. Never carry objects in both hands when walking as this interferes with keeping your balance.  Attempt to swing both arms from front to back while walking. This might require a conscious effort if Parkinson's disease has diminished your movement. It will, however, help you to maintain balance and posture, and reduce fatigue.  Consciously lift your feet off of the ground when walking. Shuffling and dragging of the feet is a common culprit in losing your balance.  When trying to navigate turns, use a "U" technique of facing forward and making a wide turn, rather than pivoting sharply.  Try to stand with your feet shoulder-length apart. When your feet are close together for any length of time, you increase your risk of losing your balance and falling.  Do one thing at a time. Don't try to walk and accomplish another task, such as reading or looking around. The decrease in your automatic reflexes complicates motor function, so the less distraction, the better.  Do not wear rubber or gripping soled shoes, they might "catch" on the floor and cause tripping.  Move slowly when changing positions. Use deliberate, concentrated movements and, if needed, use a grab bar or walking aid. Count 15 seconds between each movement. For example, when rising from a seated position, wait 15 seconds after standing to begin walking.  If balance is a continuous problem, you might want to  consider a walking aid such as a cane, walking stick, or walker. Once you've mastered walking with help, you might be ready to try it on your own again.

## 2023-08-05 LAB — VITAMIN B12: Vitamin B-12: 442 pg/mL (ref 200–1100)

## 2023-08-05 LAB — SEDIMENTATION RATE: Sed Rate: 14 mm/h (ref 0–30)

## 2023-08-05 LAB — VITAMIN B1: Vitamin B1 (Thiamine): 13 nmol/L (ref 8–30)

## 2023-08-05 LAB — C-REACTIVE PROTEIN: CRP: 3 mg/L (ref <8.0)

## 2023-08-08 ENCOUNTER — Ambulatory Visit: Payer: Self-pay | Admitting: Neurology

## 2023-08-18 ENCOUNTER — Ambulatory Visit: Attending: Neurology | Admitting: Physical Therapy

## 2023-08-18 ENCOUNTER — Other Ambulatory Visit: Payer: Self-pay

## 2023-08-18 ENCOUNTER — Encounter: Payer: Self-pay | Admitting: Physical Therapy

## 2023-08-18 DIAGNOSIS — R42 Dizziness and giddiness: Secondary | ICD-10-CM | POA: Insufficient documentation

## 2023-08-18 DIAGNOSIS — W19XXXA Unspecified fall, initial encounter: Secondary | ICD-10-CM | POA: Insufficient documentation

## 2023-08-18 DIAGNOSIS — R2689 Other abnormalities of gait and mobility: Secondary | ICD-10-CM | POA: Diagnosis not present

## 2023-08-18 DIAGNOSIS — R2681 Unsteadiness on feet: Secondary | ICD-10-CM | POA: Diagnosis not present

## 2023-08-18 NOTE — Therapy (Signed)
 OUTPATIENT PHYSICAL THERAPY VESTIBULAR EVALUATION     Patient Name: Barbara [name] MRN: [id number] DOB:[birth year], 89 y.o., female Today's Date: 08/19/2023  END OF SESSION:  PT End of Session - 08/18/23 1006     Visit Number 1    Number of Visits 13    Date for PT Re-Evaluation 09/29/23    Authorization Type BCBS Medicare    Progress Note Due on Visit 10    PT Start Time 1010    PT Stop Time 1100    PT Time Calculation (min) 50 min    Activity Tolerance Patient tolerated treatment well    Behavior During Therapy WFL for tasks assessed/performed             Past Medical History:  Diagnosis Date   Aneurysm, ophthalmic artery    Colonic polyp    Diverticulosis    Hypertension    Urine incontinence    Past Surgical History:  Procedure Laterality Date   disk removal     Patient Active Problem List   Diagnosis Date Noted   Skin tear of right upper arm without complication 07/27/2023   Vertigo as late effect of cerebrovascular accident (CVA) 06/09/2023   Poor balance 06/09/2023   Fall 02/13/2022   Head injury due to trauma 02/13/2022   Right calf pain 11/01/2021   Anxiety 06/24/2020   CKD (chronic kidney disease) stage 3, GFR 30-59 ml/min (HCC) 12/11/2019   BPPV (benign paroxysmal positional vertigo), right 07/24/2018   Left lumbar radiculopathy 05/09/2018   Sensorineural hearing loss (SNHL), bilateral 01/31/2018   Trochanteric bursitis of left hip 01/18/2018   Nephrolithiasis 01/17/2018   Vertigo 01/01/2018   Left-sided thoracic back pain 01/01/2018   Prediabetes 06/25/2016   Aortic atherosclerosis (HCC) 06/22/2016   Osteoporosis 06/22/2016   Bilateral primary osteoarthritis of first carpometacarpal joints 08/17/2015   Hypertension 03/18/2015   Temporal arteritis (HCC) 01/05/2015   Nonruptured cerebral aneurysm 12/15/2014   Aneurysm, ophthalmic artery 12/11/2014    PCP: Colene Dauphin, MD REFERRING PROVIDER: Ellene Gustin, MD   REFERRING DIAG:  R42  (ICD-10-CM) - Dizziness  W19.XXXS (ICD-10-CM) - Fall, sequela    THERAPY DIAG:  Dizziness and giddiness  Unsteadiness on feet  ONSET DATE: 08/02/2023 (MD referral)  Rationale for Evaluation and Treatment: Rehabilitation  SUBJECTIVE:   SUBJECTIVE STATEMENT: Have a very strong pulling sensation when getting up from R side, after I have been lying on R side sleeping.  Have had vertigo in the past and this is different than the spinning. Pt accompanied by: self  PERTINENT HISTORY: Per Dr. Genita Keys note:  HTN, GCA, BPPV, degenerative disc disease of spine who presents to neurology clinic with the chief complaint of dizziness.  Hx of temporal arteritis  PAIN:  Are you having pain? No  PRECAUTIONS: Fall  RED FLAGS: None   WEIGHT BEARING RESTRICTIONS: No  FALLS: Has patient fallen in last 6 months? Yes. Number of falls 1-2  LIVING ENVIRONMENT: Lives with: lives alone Lives in: House/apartment  PLOF: Independent  PATIENT GOALS: To get rid of the dizziness and pulling sensation  OBJECTIVE:  Note: Objective measures were completed at Evaluation unless otherwise noted.  DIAGNOSTIC FINDINGS: NA for this episode  COGNITION: Overall cognitive status: Within functional limits for tasks assessed   POSTURE:  rounded shoulders and forward head  Cervical ROM:  WFL AROM   BED MOBILITY:  Independent, slowed  TRANSFERS: Assistive device utilized: None  Sit to stand: Modified independence Stand to sit: Modified independence  GAIT: Gait pattern: step through pattern Distance walked: clinic distances  Assistive device utilized: None  PATIENT SURVEYS:  DHI 20  VESTIBULAR ASSESSMENT:  GENERAL OBSERVATION: No acute distress   SYMPTOM BEHAVIOR:  Subjective history: Don't lie/sleep on my right side, because when I try to get up from my right side, I get literally pulled back.  Sometimes will get a spinning sensation, but that is different.  Has had several falls, but not hit  head.  No hx of migraines  Non-Vestibular symptoms: changes in hearing and nausea/vomiting  Type of dizziness: Imbalance (Disequilibrium), Spinning/Vertigo, and Unsteady with head/body turns  Frequency: any time she gets up from right side  Duration: seconds-minutes  Aggravating factors: Induced by position change: rolling to the right, supine to sit, and from right side, describes PULLING sensation  Relieving factors: head stationary, lying supine, and slow movements  Progression of symptoms: unchanged  OCULOMOTOR EXAM:  Wears glasses; has head tilted slightly toward R  Ocular Alignment: normal  Ocular ROM: No Limitations  Spontaneous Nystagmus: absent  Gaze-Induced Nystagmus: absent  Smooth Pursuits: intact  Saccades: intact  Convergence/Divergence: 9 cm   VESTIBULAR - OCULAR REFLEX:   Slow VOR: Normal  VOR Cancellation: Normal  Head-Impulse Test: Difficult to assess; HIT Right: pt guarded HIT Left: pt guarded  Dynamic Visual Acuity: NT   M-CTSIB  Condition 1: Firm Surface, EO 30 Sec, Normal Sway  Condition 2: Firm Surface, EC 30 Sec, Mild Sway  Condition 3: Foam Surface, EO 30 Sec, Moderate Sway  Condition 4: Foam Surface, EC 24.69 Sec, Severe Sway    POSITIONAL TESTING: Right Dix-Hallpike: no nystagmus Left Dix-Hallpike: no nystagmus and slight "pull up" upon sitting up Right Roll Test: no nystagmus Left Roll Test: no nystagmus  MOTION SENSITIVITY:  Motion Sensitivity Quotient Intensity: 0 = none, 1 = Lightheaded, 2 = Mild, 3 = Moderate, 4 = Severe, 5 = Vomiting  Intensity  1. Sitting to supine   2. Supine to L side   3. Supine to R side   4. Supine to sitting   5. L Hallpike-Dix   6. Up from L    7. R Hallpike-Dix   8. Up from R    9. Sitting, head tipped to L knee   10. Head up from L knee   11. Sitting, head tipped to R knee   12. Head up from R knee   13. Sitting head turns x5   14.Sitting head nods x5   15. In stance, 180 turn to L    16. In stance,  180 turn to R                                                                                                                                 TREATMENT DATE: 08/18/2023    Habituation:  Repeated Rolling: comment: provided as HEP for rolling R<>supine  PATIENT EDUCATION: Education details: Eval results, POC, HEP to  address habituation and will reassess for positional vertigo Person educated: Patient Education method: Explanation and Handouts Education comprehension: verbalized understanding  HOME EXERCISE PROGRAM: Access Code: [email or web address] URL: [email or web address] Date: 08/19/2023 Prepared by: [facility] - [facility]  Exercises - Supine to Right Sidelying Vestibular Habituation  - 1 x daily - 7 x weekly - 1 sets - 3 reps - 1-2 hold  GOALS: Goals reviewed with patient? Yes  SHORT TERM GOALS: Target date: 09/14/2023  Pt will be independent with HEP for improved bed mobility, dizziness. Baseline: Goal status: INITIAL  2.  Pt will report no dizziness or pulling sensation with bed mobility. Baseline:  Goal status: INITIAL   LONG TERM GOALS: Target date: 09/29/2023  Pt will be independent with progression of HEP for improved dizziness, balance. Baseline:  Goal status: INITIAL  2.  Pt will improve DHI score to less than or equal to 10 for improved dizziness. Baseline:  Goal status: INITIAL  3.  Pt will improve Condition 4 on MCTSIB to at least 30 sec and mod sway or better, for improved balance. Baseline: 24 sec severe sway Goal status: INITIAL   ASSESSMENT:  CLINICAL IMPRESSION: Patient is an 89 y.o. female who was seen today for physical therapy evaluation and treatment for reports of strong pulling sensation when she gets up from sleeping on her right side.  She reports having BPPV in the past and this is not the spinning dizziness associated with that.  She has had several falls, but denies hitting her head.  She presents today  with oculomotor and VOR testing WFL.  Difficult to assess Head impulse testing due to guarding.  With positional testing, she does not have nystagmus and she only reports abnormal "pulling sensation" when coming up to sit from [facility] position.   Initiated rolling for habituation today, and will need to reassess to see if any positional vertigo can be brought on.  Also, of note, pt does demo decreased vestibular system use for balance on MCTSIB testing.  She will benefit from skilled PT to address dizziness, balance.  OBJECTIVE IMPAIRMENTS: decreased balance and dizziness.   ACTIVITY LIMITATIONS: sleeping, bed mobility, and locomotion level  PARTICIPATION LIMITATIONS: community activity  PERSONAL FACTORS: 3+ comorbidities: see above are also affecting patient's functional outcome.   REHAB POTENTIAL: Good  CLINICAL DECISION MAKING: Stable/uncomplicated  EVALUATION COMPLEXITY: Low   PLAN:  PT FREQUENCY: 1-2x/week  PT DURATION: 6 weeks plus eval visit  PLANNED INTERVENTIONS: 97750- Physical Performance Testing, 97110-Therapeutic exercises, 97530- Therapeutic activity, [id number]- Neuromuscular re-education, 97535- Self Care, 86578- Gait training, [phone]- Canalith repositioning, Patient/Family education, and Balance training  PLAN FOR NEXT SESSION: Reassess for positional vertigo and treat as appropriate.  May consider Brandt-Daroff for habituation; multi-sensory balance training   Lean Jaeger W., PT 08/19/2023, 10:32 AM   [facility] at [facility] [address] [place], [address] [place], [place] [address] Phone # [phone] Fax # [phone]

## 2023-08-23 NOTE — Therapy (Signed)
 OUTPATIENT PHYSICAL THERAPY VESTIBULAR TREATMENT     Patient Name: Barbara [name] MRN: [id number] DOB:07/10/1932, 89 y.o., female Today's Date: 08/24/2023  END OF SESSION:  PT End of Session - 08/24/23 0910     Visit Number 2    Number of Visits 13    Date for PT Re-Evaluation 09/29/23    Authorization Type BCBS Medicare    Progress Note Due on Visit 10    PT Start Time 0850    PT Stop Time 0909    PT Time Calculation (min) 19 min    Activity Tolerance Patient tolerated treatment well    Behavior During Therapy [facility] for tasks assessed/performed           Past Medical History:  Diagnosis Date   Aneurysm, ophthalmic artery    Colonic polyp    Diverticulosis    Hypertension    Urine incontinence    Past Surgical History:  Procedure Laterality Date   disk removal     Patient Active Problem List   Diagnosis Date Noted   Skin tear of right upper arm without complication 07/27/2023   Vertigo as late effect of cerebrovascular accident (CVA) 06/09/2023   Poor balance 06/09/2023   Fall 02/13/2022   Head injury due to trauma 02/13/2022   Right calf pain 11/01/2021   Anxiety 06/24/2020   CKD (chronic kidney disease) stage 3, GFR 30-59 ml/min (HCC) 12/11/2019   BPPV (benign paroxysmal positional vertigo), right 07/24/2018   Left lumbar radiculopathy 05/09/2018   Sensorineural hearing loss (SNHL), bilateral 01/31/2018   Trochanteric bursitis of left hip 01/18/2018   Nephrolithiasis 01/17/2018   Vertigo 01/01/2018   Left-sided thoracic back pain 01/01/2018   Prediabetes 06/25/2016   Aortic atherosclerosis (HCC) 06/22/2016   Osteoporosis 06/22/2016   Bilateral primary osteoarthritis of first carpometacarpal joints 08/17/2015   Hypertension 03/18/2015   Temporal arteritis (HCC) 01/05/2015   Nonruptured cerebral aneurysm 12/15/2014   Aneurysm, ophthalmic artery 12/11/2014    PCP: Colene Dauphin, MD REFERRING PROVIDER: Ellene Gustin, MD   REFERRING DIAG:  R42  (ICD-10-CM) - Dizziness  W19.XXXS (ICD-10-CM) - Fall, sequela    THERAPY DIAG:  Dizziness and giddiness  Unsteadiness on feet  ONSET DATE: 08/02/2023 (MD referral)  Rationale for Evaluation and Treatment: Rehabilitation  SUBJECTIVE:   SUBJECTIVE STATEMENT: Reports continued feelings of being thrown back when sitting up from her R side. She reports that she feels that she needs an MRI as she is worried about her hx of head trauma with hematoma. Pt reports that she does not believe PT will help her.   Pt accompanied by: self  PERTINENT HISTORY: Per Dr. Genita Keys note:  HTN, GCA, BPPV, degenerative disc disease of spine who presents to neurology clinic with the chief complaint of dizziness.  Hx of temporal arteritis  PAIN:  Are you having pain? No  PRECAUTIONS: Fall  RED FLAGS: None   WEIGHT BEARING RESTRICTIONS: No  FALLS: Has patient fallen in last 6 months? Yes. Number of falls 1-2  LIVING ENVIRONMENT: Lives with: lives alone Lives in: House/apartment  PLOF: Independent  PATIENT GOALS: To get rid of the dizziness and pulling sensation  OBJECTIVE:      TODAY'S TREATMENT: 08/24/23  PATIENT EDUCATION: Education details: edu on previous exam findings and edu on positional vertigo; encouraged that patient tries to participate with therapy to assess if PT is helpful for her before returning back to MD- pt requested to talk to Dr. Donnette Gal first before returning; 30 day hold  Person educated: Patient Education method: Explanation Education comprehension: verbalized understanding     Note: Objective measures were completed at Evaluation unless otherwise noted.  DIAGNOSTIC FINDINGS: NA for this episode  COGNITION: Overall cognitive status: Within functional limits for tasks assessed   POSTURE:  rounded shoulders and forward head  Cervical ROM:  WFL AROM   BED MOBILITY:  Independent, slowed  TRANSFERS: Assistive device utilized: None  Sit to stand: Modified  independence Stand to sit: Modified independence  GAIT: Gait pattern: step through pattern Distance walked: clinic distances  Assistive device utilized: None  PATIENT SURVEYS:  DHI 20  VESTIBULAR ASSESSMENT:  GENERAL OBSERVATION: No acute distress   SYMPTOM BEHAVIOR:  Subjective history: Don't lie/sleep on my right side, because when I try to get up from my right side, I get literally pulled back.  Sometimes will get a spinning sensation, but that is different.  Has had several falls, but not hit head.  No hx of migraines  Non-Vestibular symptoms: changes in hearing and nausea/vomiting  Type of dizziness: Imbalance (Disequilibrium), Spinning/Vertigo, and Unsteady with head/body turns  Frequency: any time she gets up from right side  Duration: seconds-minutes  Aggravating factors: Induced by position change: rolling to the right, supine to sit, and from right side, describes PULLING sensation  Relieving factors: head stationary, lying supine, and slow movements  Progression of symptoms: unchanged  OCULOMOTOR EXAM:  Wears glasses; has head tilted slightly toward R  Ocular Alignment: normal  Ocular ROM: No Limitations  Spontaneous Nystagmus: absent  Gaze-Induced Nystagmus: absent  Smooth Pursuits: intact  Saccades: intact  Convergence/Divergence: 9 cm   VESTIBULAR - OCULAR REFLEX:   Slow VOR: Normal  VOR Cancellation: Normal  Head-Impulse Test: Difficult to assess; HIT Right: pt guarded HIT Left: pt guarded  Dynamic Visual Acuity: NT   M-CTSIB  Condition 1: Firm Surface, EO 30 Sec, Normal Sway  Condition 2: Firm Surface, EC 30 Sec, Mild Sway  Condition 3: Foam Surface, EO 30 Sec, Moderate Sway  Condition 4: Foam Surface, EC 24.69 Sec, Severe Sway    POSITIONAL TESTING: Right Dix-Hallpike: no nystagmus Left Dix-Hallpike: no nystagmus and slight pull up upon sitting up Right Roll Test: no nystagmus Left Roll Test: no nystagmus  MOTION SENSITIVITY:  Motion  Sensitivity Quotient Intensity: 0 = none, 1 = Lightheaded, 2 = Mild, 3 = Moderate, 4 = Severe, 5 = Vomiting  Intensity  1. Sitting to supine   2. Supine to L side   3. Supine to R side   4. Supine to sitting   5. L Hallpike-Dix   6. Up from L    7. R Hallpike-Dix   8. Up from R    9. Sitting, head tipped to L knee   10. Head up from L knee   11. Sitting, head tipped to R knee   12. Head up from R knee   13. Sitting head turns x5   14.Sitting head nods x5   15. In stance, 180 turn to L    16. In stance, 180 turn to R  TREATMENT DATE: 08/18/2023    Habituation:  Repeated Rolling: comment: provided as HEP for rolling R<>supine  PATIENT EDUCATION: Education details: Eval results, POC, HEP to address habituation and will reassess for positional vertigo Person educated: Patient Education method: Explanation and Handouts Education comprehension: verbalized understanding  HOME EXERCISE PROGRAM: Access Code: [email or web address] URL: [email or web address] Date: 08/19/2023 Prepared by: [facility] - Outpatient  Rehab - [facility]  Exercises - Supine to Right Sidelying Vestibular Habituation  - 1 x daily - 7 x weekly - 1 sets - 3 reps - 1-2 hold  GOALS: Goals reviewed with patient? Yes  SHORT TERM GOALS: Target date: 09/14/2023  Pt will be independent with HEP for improved bed mobility, dizziness. Baseline: Goal status: IN PROGRESS  2.  Pt will report no dizziness or pulling sensation with bed mobility. Baseline:  Goal status: IN PROGRESS   LONG TERM GOALS: Target date: 09/29/2023  Pt will be independent with progression of HEP for improved dizziness, balance. Baseline:  Goal status: IN PROGRESS  2.  Pt will improve DHI score to less than or equal to 10 for improved dizziness. Baseline:  Goal status: IN PROGRESS  3.  Pt will  improve Condition 4 on MCTSIB to at least 30 sec and mod sway or better, for improved balance. Baseline: 24 sec severe sway Goal status: IN PROGRESS   ASSESSMENT:  CLINICAL IMPRESSION: Patient arrived to session with concerns that she feels she needs an MRI before participating in therapy d/t concern about hx of head trauma with scalp hematoma. Upon discussion of her symptoms, patient does seem to have a positional component to her symptoms but she kindly declined further vestibular testing today. Placing patient on 30 day hold to allow her to speak to her PCP before proceeding with therapy.   OBJECTIVE IMPAIRMENTS: decreased balance and dizziness.   ACTIVITY LIMITATIONS: sleeping, bed mobility, and locomotion level  PARTICIPATION LIMITATIONS: community activity  PERSONAL FACTORS: 3+ comorbidities: see above are also affecting patient's functional outcome.   REHAB POTENTIAL: Good  CLINICAL DECISION MAKING: Stable/uncomplicated  EVALUATION COMPLEXITY: Low   PLAN:  PT FREQUENCY: 1-2x/week  PT DURATION: 6 weeks plus eval visit  PLANNED INTERVENTIONS: 97750- Physical Performance Testing, 97110-Therapeutic exercises, 97530- Therapeutic activity, [id number]- Neuromuscular re-education, 97535- Self Care, 86761- Gait training, [phone]- Canalith repositioning, Patient/Family education, and Balance training  PLAN FOR NEXT SESSION: Reassess for positional vertigo and treat as appropriate.  May consider Brandt-Daroff for habituation; multi-sensory balance training   Thaddeus Filippo, PT, DPT 08/24/23 9:12 AM  [facility] at [facility] [address] [place], [address] [place], [place] [address] Phone # [phone] Fax # [phone]

## 2023-08-24 ENCOUNTER — Encounter: Payer: Self-pay | Admitting: Physical Therapy

## 2023-08-24 ENCOUNTER — Ambulatory Visit: Admitting: Physical Therapy

## 2023-08-24 DIAGNOSIS — R2681 Unsteadiness on feet: Secondary | ICD-10-CM | POA: Diagnosis not present

## 2023-08-24 DIAGNOSIS — R42 Dizziness and giddiness: Secondary | ICD-10-CM

## 2023-08-24 DIAGNOSIS — W19XXXA Unspecified fall, initial encounter: Secondary | ICD-10-CM | POA: Diagnosis not present

## 2023-08-24 DIAGNOSIS — R2689 Other abnormalities of gait and mobility: Secondary | ICD-10-CM | POA: Diagnosis not present

## 2023-08-29 ENCOUNTER — Ambulatory Visit: Admitting: Physical Therapy

## 2023-08-31 ENCOUNTER — Ambulatory Visit: Admitting: Physical Therapy

## 2023-11-18 ENCOUNTER — Other Ambulatory Visit: Payer: Self-pay | Admitting: Internal Medicine

## 2023-11-20 ENCOUNTER — Ambulatory Visit (INDEPENDENT_AMBULATORY_CARE_PROVIDER_SITE_OTHER)

## 2023-11-20 VITALS — Ht 61.0 in | Wt 140.0 lb

## 2023-11-20 DIAGNOSIS — Z0000 Encounter for general adult medical examination without abnormal findings: Secondary | ICD-10-CM | POA: Diagnosis not present

## 2023-11-20 NOTE — Progress Notes (Signed)
 Subjective:   Barbara [name] is a 89 y.o. who presents for a Medicare Wellness preventive visit.  As a reminder, Annual Wellness Visits don't include a physical exam, and some assessments may be limited, especially if this visit is performed virtually. We may recommend an in-person follow-up visit with your provider if needed.  Visit Complete: Virtual I connected with  Barbara [name] on 11/20/23 by a audio enabled telemedicine application and verified that I am speaking with the correct person using two identifiers.  Patient Location: Home  Provider Location: Home Office  I discussed the limitations of evaluation and management by telemedicine. The patient expressed understanding and agreed to proceed.  Vital Signs: Because this visit was a virtual/telehealth visit, some criteria may be missing or patient reported. Any vitals not documented were not able to be obtained and vitals that have been documented are patient reported.  VideoDeclined- This patient declined Librarian, academic. Therefore the visit was completed with audio only.  Persons Participating in Visit: Patient.  AWV [name]: Yes: Patient Medicare AWV [name] was completed by the patient on 11/17/2023; I have confirmed that all information answered by patient is correct and no changes since this date.  Cardiac Risk Factors include: advanced age (>41men, >12 women);hypertension;Other (see comment)     Objective:    Today's Vitals   11/20/23 1056  Weight: 140 lb (63.5 kg)  Height: 5' 1 (1.549 m)   Body mass index is 26.45 kg/m.     11/20/2023   11:00 AM 07/25/2023    3:16 PM 11/25/2022   10:42 AM 02/09/2022   11:22 AM 11/11/2021    9:17 AM 11/10/2020    8:58 AM  Advanced Directives  Does Patient Have a Medical Advance Directive? Yes Yes Yes No Yes Yes  Type of Estate agent of [place];Living will Healthcare Power of [employer] of  [place];Living will  Healthcare Power of [place];Living will Living will;Healthcare Power of Attorney  Does patient want to make changes to medical advance directive?      No - Patient declined  Copy of Healthcare Power of Attorney in Chart? No - copy requested  No - copy requested  No - copy requested No - copy requested  Would patient like information on creating a medical advance directive?    No - Patient declined      Current Medications (verified) Outpatient Encounter Medications as of 11/20/2023  Medication Sig   acetaminophen (TYLENOL) 650 MG CR tablet Take 650 mg by mouth daily as needed for pain.   benazepril  (LOTENSIN ) 5 MG tablet Take 1 tablet (5 mg total) by mouth daily.   Calcium-Vitamin D -Vitamin K 500-500-40 MG-UNT-MCG CHEW Chew by mouth. TAKE 2 A DAY   diclofenac sodium (VOLTAREN) 1 % GEL as needed.   fish oil-omega-3 fatty acids 1000 MG capsule Take 1 capsule by mouth daily.   Methylsulfonylmethane (MSM PO) Take by mouth.   sertraline  (ZOLOFT ) 25 MG tablet Take 1 tablet (25 mg total) by mouth daily.   Vitamin D3 (VITAMIN D ) 25 MCG tablet Take 1,000 Units by mouth daily.   No facility-administered encounter medications on file as of 11/20/2023.    Allergies (verified) Sertraline  hcl, Amlodipine , Fosamax [alendronate], Lexapro  [escitalopram ], Metoprolol , and Telmisartan    History: Past Medical History:  Diagnosis Date   Aneurysm, ophthalmic artery    Colonic polyp    Diverticulosis    Hypertension    Urine incontinence    Past Surgical History:  Procedure Laterality  Date   disk removal     History reviewed. No pertinent family history. Social History   Socioeconomic History   Marital status: Widowed    Spouse name: Not on file   Number of children: 3   Years of education: Not on file   Highest education level: 12th grade  Occupational History   Occupation: Retired  Tobacco Use   Smoking status: Former   Smokeless tobacco: Former  Building services engineer  status: Never Used  Substance and Sexual Activity   Alcohol use: No   Drug use: No   Sexual activity: Not on file  Other Topics Concern   Not on file  Social History Narrative   Lives alone./2025   Are you right handed or left handed? left   Are you currently employed ?    What is your current occupation? retired   Do you live at home alone? yes   Who lives with you?    What type of home do you live in: 1 story or 2 story? one    Caffiene 1 tea a day   Social Drivers of Corporate investment banker Strain: Low Risk  (11/17/2023)   Overall Financial Resource Strain (CARDIA)    Difficulty of Paying Living Expenses: Not hard at all  Food Insecurity: No Food Insecurity (11/17/2023)   Hunger Vital Sign    Worried About Running Out of Food in the Last Year: Never true    Ran Out of Food in the Last Year: Never true  Transportation Needs: No Transportation Needs (11/17/2023)   PRAPARE - Administrator, Civil Service (Medical): No    Lack of Transportation (Non-Medical): No  Physical Activity: Insufficiently Active (11/17/2023)   Exercise Vital Sign    Days of Exercise per Week: 2 days    Minutes of Exercise per Session: 30 min  Stress: No Stress Concern Present (11/17/2023)   Barbara [name]    Feeling of Stress: Not at all  Social Connections: Moderately Integrated (11/17/2023)   Social Connection and Isolation Panel    Frequency of Communication with Friends and Family: More than three times a week    Frequency of Social Gatherings with Friends and Family: Twice a week    Attends Religious Services: More than 4 times per year    Active Member of [employer] or Organizations: Yes    Attends Banker Meetings: More than 4 times per year    Marital Status: Widowed    Tobacco Counseling Counseling given: Not Answered    Clinical Intake:  Pre-visit preparation completed: Yes  Pain : No/denies pain  (Bilateral primary osteoarthritis of first carpometacarpal joints)     BMI - recorded: 26.45 Nutritional Status: BMI 25 -29 Overweight Nutritional Risks: None Diabetes: No  Lab Results  Component Value Date   HGBA1C 5.6 06/09/2023   HGBA1C 5.7 05/19/2022   HGBA1C 5.7 11/18/2021     How often do you need to have someone help you when you read instructions, pamphlets, or other written materials from your doctor or pharmacy?: 1 - Never  Interpreter Needed?: No  Information entered by :: Barbara [name], RMA   Activities of Daily Living     11/17/2023   10:27 AM 11/24/2022    7:45 PM  In your present state of health, do you have any difficulty performing the following activities:  Hearing? 1 1  Comment wear hearing aides wears hearing aides  Vision? 0 0  Difficulty concentrating or making decisions? 0 1  Walking or climbing stairs? 0 0  Dressing or bathing? 0 0  Doing errands, shopping? 0 0  Preparing Food and eating ? N N  Using the Toilet? N N  In the past six months, have you accidently leaked urine? Y Y  Do you have problems with loss of bowel control? N N  Managing your Medications? N N  Managing your Finances? N N  Housekeeping or managing your Housekeeping? N N    Patient Care Team: Geofm Glade PARAS, MD as PCP - General (Internal Medicine) Livingston Rigg, MD as Consulting Physician (Dermatology) Leslee Reusing, MD as Consulting Physician (Ophthalmology)  I have updated your Care Teams any recent Medical Services you may have received from other providers in the past year.     Assessment:   This is a routine wellness examination for Barbara [name].  Hearing/Vision screen Hearing Screening - Comments:: wear hearing aides Vision Screening - Comments:: Wears eyeglasses/ [place]   Goals Addressed             This Visit's Progress    My goal is to continue to eat healthy, watch my weight and maintain my health.   On track      Depression Screen     11/20/2023    11:02 AM 11/25/2022   10:45 AM 05/19/2022    9:54 AM 02/17/2022   10:48 AM 11/18/2021    8:43 AM 11/11/2021    9:16 AM 11/01/2021    2:21 PM  PHQ 2/9 Scores  PHQ - 2 Score 0 0 0 0 0 0 0  PHQ- 9 Score 2 2 0 0       Fall Risk     11/17/2023   10:27 AM 11/24/2022    7:45 PM 05/19/2022    9:54 AM 02/17/2022   10:44 AM 11/18/2021   11:19 AM  Fall Risk   Falls in the past year? 1 1 1 1  0  Number falls in past yr: 0 0 0 0 0  Injury with Fall? 1 1 1 1  0  Risk for fall due to : Impaired mobility;Impaired balance/gait  History of fall(s) No Fall Risks No Fall Risks  Follow up Falls evaluation completed;Falls prevention discussed Falls evaluation completed;Falls prevention discussed Falls evaluation completed Falls evaluation completed  Falls evaluation completed      Data saved with a previous flowsheet row definition    MEDICARE RISK AT HOME:  Medicare Risk at Home Any stairs in or around the home?: (Patient-Rptd) Yes If so, are there any without handrails?: (Patient-Rptd) No Home free of loose throw rugs in walkways, pet beds, electrical cords, etc?: (Patient-Rptd) No Adequate lighting in your home to reduce risk of falls?: (Patient-Rptd) Yes Life alert?: (Patient-Rptd) No Use of a cane, walker or w/c?: (Patient-Rptd) No Shower chair or bench in shower?: (Patient-Rptd) No Elevated toilet seat or a handicapped toilet?: (Patient-Rptd) Yes  TIMED UP AND GO:  Was the test performed?  No  Cognitive Function: Declined/Normal: No cognitive concerns noted by patient or family. Patient alert, oriented, able to answer questions appropriately and recall recent events. No signs of memory loss or confusion.        11/25/2022   10:43 AM 11/11/2021    9:20 AM  6CIT Screen  What Year? 0 points 0 points  What month? 0 points 0 points  What time? 0 points 0 points  Count back from 20 0 points 0 points  Months in reverse 0 points 0 points  Repeat phrase 2 points 0 points  Total Score 2 points 0  points    Immunizations Immunization History  Administered Date(s) Administered   PFIZER(Purple Top)SARS-COV-2 Vaccination 04/02/2019, 04/22/2019, 12/16/2019   Pneumococcal Conjugate-13 06/22/2016   Pneumococcal Polysaccharide-23 05/31/2004   Td 05/31/2004   Tdap 01/01/2018, 07/25/2023   Zoster Recombinant(Shingrix) 11/11/2020, 01/15/2021   Zoster, Live 12/12/2012    Screening Tests Health Maintenance  Topic Date Due   COVID-19 Vaccine (4 - 2025-26 season) 11/13/2023   Medicare Annual Wellness (AWV)  11/25/2023   DEXA SCAN  05/26/2024   DTaP/Tdap/Td (4 - Td or Tdap) 07/24/2033   Pneumococcal Vaccine: 50+ Years  Completed   Zoster Vaccines- Shingrix  Completed   HPV VACCINES  Aged Out   Meningococcal B Vaccine  Aged Out   Influenza Vaccine  Discontinued    Health Maintenance Items Addressed: See Nurse Notes at the end of this note  Additional Screening:  Vision Screening: Recommended annual ophthalmology exams for early detection of glaucoma and other disorders of the eye. Is the patient up to date with their annual eye exam?  No  Who is the provider or what is the name of the office in which the patient attends annual eye exams? Dr. Leslee  Dental Screening: Recommended annual dental exams for proper oral hygiene  Community Resource Referral / Chronic Care Management: CRR required this visit?  No   CCM required this visit?  No   Plan:    I have personally reviewed and noted the following in the patient's chart:   Medical and social history Use of alcohol, tobacco or illicit drugs  Current medications and supplements including opioid prescriptions. Patient is not currently taking opioid prescriptions. Functional ability and status Nutritional status Physical activity Advanced directives List of other physicians Hospitalizations, surgeries, and ER visits in previous 12 months Vitals Screenings to include cognitive, depression, and falls Referrals and  appointments  In addition, I have reviewed and discussed with patient certain preventive protocols, quality metrics, and best practice recommendations. A written personalized care plan for preventive services as well as general preventive health recommendations were provided to patient.   Evalena Fujii L Suzette Flagler, CMA   11/20/2023   After Visit Summary: (MyChart) Due to this being a telephonic visit, the after visit summary with patients personalized plan was offered to patient via MyChart   Notes: Patient is up to date on all health maintenance with no concerns to address today.

## 2023-11-20 NOTE — Patient Instructions (Signed)
 [name],  Thank you for taking the time for your Medicare Wellness Visit. I appreciate your continued commitment to your health goals. Please review the care plan we discussed, and feel free to reach out if I can assist you further.  Medicare recommends these wellness visits once per year to help you and your care team stay ahead of potential health issues. These visits are designed to focus on prevention, allowing your provider to concentrate on managing your acute and chronic conditions during your regular appointments.  Please note that Annual Wellness Visits do not include a physical exam. Some assessments may be limited, especially if the visit was conducted virtually. If needed, we may recommend a separate in-person follow-up with your provider.  Ongoing Care Seeing your primary care provider every 3 to 6 months helps us  monitor your health and provide consistent, personalized care. Next office visit on 12/20/2023.  Keep up the good work.  Referrals If a referral was made during today's visit and you haven't received any updates within two weeks, please contact the referred provider directly to check on the status.  Recommended Screenings:  Health Maintenance  Topic Date Due   COVID-19 Vaccine (4 - 2025-26 season) 11/13/2023   Medicare Annual Wellness Visit  11/25/2023   DEXA scan (bone density measurement)  05/26/2024   DTaP/Tdap/Td vaccine (4 - Td or Tdap) 07/24/2033   Pneumococcal Vaccine for age over 92  Completed   Zoster (Shingles) Vaccine  Completed   HPV Vaccine  Aged Out   Meningitis B Vaccine  Aged Out   Flu Shot  Discontinued       11/20/2023   11:00 AM  Advanced Directives  Does Patient Have a Medical Advance Directive? Yes  Type of Estate agent of [place];Living will  Copy of Healthcare Power of Attorney in Chart? No - copy requested   Advance Care Planning is important because it: Ensures you receive medical care that aligns with your  values, goals, and preferences. Provides guidance to your family and loved ones, reducing the emotional burden of decision-making during critical moments.  Vision: Annual vision screenings are recommended for early detection of glaucoma, cataracts, and diabetic retinopathy. These exams can also reveal signs of chronic conditions such as diabetes and high blood pressure.  Dental: Annual dental screenings help detect early signs of oral cancer, gum disease, and other conditions linked to overall health, including heart disease and diabetes.  Please see the attached documents for additional preventive care recommendations.

## 2023-11-22 ENCOUNTER — Encounter: Payer: Self-pay | Admitting: Internal Medicine

## 2023-12-19 ENCOUNTER — Encounter: Payer: Self-pay | Admitting: Internal Medicine

## 2023-12-19 NOTE — Progress Notes (Unsigned)
 Subjective:    Patient ID: Barbara [name], female    DOB: 1932-08-13, 89 y.o.   MRN: [id number]      HPI Barbara [name] is here for a Physical exam and her chronic medical problems.   She was a little dizzy this morning.  She continues to have dizziness and plans on seeing ENT for further evaluation-vestibular testing.  She does have an appointment with Dr. Thaddeus and needs an official referral   Her left groin - since hip - has had stinging sensation and hurts and then she has a BM.  At times pain is significant ache.  Maybe started a couple of months ago after she picked up something heavy so she figured she just strained herself.  Pain resolves after BM. Can have it daily.  Can have a lot of stool or pieces of stool.  Sometimes the pain might come on with certain changes in position, but that is not consistent.  She denies any bulges.     Medications and allergies reviewed with patient and updated if appropriate.  Current Outpatient Medications on File Prior to Visit  Medication Sig Dispense Refill   acetaminophen (TYLENOL) 650 MG CR tablet Take 650 mg by mouth daily as needed for pain.     benazepril  (LOTENSIN ) 5 MG tablet Take 1 tablet (5 mg total) by mouth daily. 90 tablet 1   Calcium-Vitamin D -Vitamin K 500-500-40 MG-UNT-MCG CHEW Chew by mouth. TAKE 2 A DAY     diclofenac sodium (VOLTAREN) 1 % GEL as needed.     fish oil-omega-3 fatty acids 1000 MG capsule Take 1 capsule by mouth daily.     Methylsulfonylmethane (MSM PO) Take by mouth.     sertraline  (ZOLOFT ) 25 MG tablet Take 1 tablet (25 mg total) by mouth daily. 90 tablet 3   Vitamin D3 (VITAMIN D ) 25 MCG tablet Take 1,000 Units by mouth daily.     No current facility-administered medications on file prior to visit.    Review of Systems  Constitutional:  Negative for fever.  Eyes:  Negative for visual disturbance.  Respiratory:  Negative for cough, shortness of breath and wheezing.   Cardiovascular:  Positive for leg  swelling (sometimes - mild). Negative for chest pain and palpitations.  Gastrointestinal:  Positive for constipation (at times) and diarrhea (at times - depending on what she eats). Negative for abdominal pain and blood in stool.       No gerd  Genitourinary:  Negative for dysuria.  Musculoskeletal:  Positive for arthralgias and back pain.  Skin:  Negative for rash.  Neurological:  Positive for dizziness (occ). Negative for light-headedness and headaches.  Hematological:  Bruises/bleeds easily (easy bruising).  Psychiatric/Behavioral:  Negative for dysphoric mood. The patient is not nervous/anxious.        Objective:   Vitals:   12/20/23 1020  BP: 130/70  Pulse: 70  Temp: 98 F (36.7 C)  SpO2: 96%   Filed Weights   12/20/23 1020  Weight: 138 lb (62.6 kg)   Body mass index is 26.07 kg/m.  BP Readings from Last 3 Encounters:  12/20/23 130/70  08/02/23 (!) 160/71  07/27/23 116/70    Wt Readings from Last 3 Encounters:  12/20/23 138 lb (62.6 kg)  11/20/23 140 lb (63.5 kg)  08/02/23 140 lb (63.5 kg)       Physical Exam Constitutional: She appears well-developed and well-nourished. No distress.  HENT:  Head: Normocephalic and atraumatic.  Right Ear: External ear normal. Normal ear  canal and TM Left Ear: External ear normal.  Normal ear canal and TM Mouth/Throat: Oropharynx is clear and moist.  Eyes: Conjunctivae normal.  Neck: Neck supple. No tracheal deviation present. No thyromegaly present.  No carotid bruit  Cardiovascular: Normal rate, regular rhythm and normal heart sounds.   No murmur heard.  No edema. Pulmonary/Chest: Effort normal and breath sounds normal. No respiratory distress. She has no wheezes. She has no rales.  Breast: deferred   Abdominal: Soft. She exhibits no distension. There is slight tenderness in the left/left lower quadrant without any rebound or guarding.  No bulge or obvious hernia.  Lymphadenopathy: She has no cervical adenopathy.   Skin: Skin is warm and dry. She is not diaphoretic.  Psychiatric: She has a normal mood and affect. Her behavior is normal.     Lab Results  Component Value Date   WBC 6.1 06/09/2023   HGB 13.9 06/09/2023   HCT 42.0 06/09/2023   PLT 166.0 06/09/2023   GLUCOSE 75 06/09/2023   CHOL 204 (H) 06/09/2023   TRIG 147.0 06/09/2023   HDL 79.10 06/09/2023   LDLCALC 96 06/09/2023   ALT 10 06/09/2023   AST 15 06/09/2023   NA 142 06/09/2023   K 4.4 06/09/2023   CL 103 06/09/2023   CREATININE 1.13 06/09/2023   BUN 29 (H) 06/09/2023   CO2 31 06/09/2023   TSH 1.30 12/11/2019   HGBA1C 5.6 06/09/2023         Assessment & Plan:   Physical exam: Screening blood work  ordered Exercise  minimal Weight  is good Substance abuse  none   Reviewed recommended immunizations.   Health Maintenance  Topic Date Due   COVID-19 Vaccine (4 - 2025-26 season) 01/05/2024 (Originally 11/13/2023)   DEXA SCAN  05/26/2024   Medicare Annual Wellness (AWV)  11/19/2024   DTaP/Tdap/Td (4 - Td or Tdap) 07/24/2033   Pneumococcal Vaccine: 50+ Years  Completed   Zoster Vaccines- Shingrix  Completed   Meningococcal B Vaccine  Aged Out   Influenza Vaccine  Discontinued          See Problem List for Assessment and Plan of chronic medical problems.

## 2023-12-19 NOTE — Patient Instructions (Incomplete)
 Blood work was ordered.       Medications changes include :   try taking a stool softener daily   If your pain in your lower abdomen does not go away we may need to do imaging.    Return in about 6 months (around 06/19/2024) for follow up.    Health Maintenance, Female Adopting a healthy lifestyle and getting preventive care are important in promoting health and wellness. Ask your health care provider about: The right schedule for you to have regular tests and exams. Things you can do on your own to prevent diseases and keep yourself healthy. What should I know about diet, weight, and exercise? Eat a healthy diet  Eat a diet that includes plenty of vegetables, fruits, low-fat dairy products, and lean protein. Do not eat a lot of foods that are high in solid fats, added sugars, or sodium. Maintain a healthy weight Body mass index (BMI) is used to identify weight problems. It estimates body fat based on height and weight. Your health care provider can help determine your BMI and help you achieve or maintain a healthy weight. Get regular exercise Get regular exercise. This is one of the most important things you can do for your health. Most adults should: Exercise for at least 150 minutes each week. The exercise should increase your heart rate and make you sweat (moderate-intensity exercise). Do strengthening exercises at least twice a week. This is in addition to the moderate-intensity exercise. Spend less time sitting. Even light physical activity can be beneficial. Watch cholesterol and blood lipids Have your blood tested for lipids and cholesterol at 88 years of age, then have this test every 5 years. Have your cholesterol levels checked more often if: Your lipid or cholesterol levels are high. You are older than 88 years of age. You are at high risk for heart disease. What should I know about cancer screening? Depending on your health history and family history, you may  need to have cancer screening at various ages. This may include screening for: Breast cancer. Cervical cancer. Colorectal cancer. Skin cancer. Lung cancer. What should I know about heart disease, diabetes, and high blood pressure? Blood pressure and heart disease High blood pressure causes heart disease and increases the risk of stroke. This is more likely to develop in people who have high blood pressure readings or are overweight. Have your blood pressure checked: Every 3-5 years if you are 73-93 years of age. Every year if you are 25 years old or older. Diabetes Have regular diabetes screenings. This checks your fasting blood sugar level. Have the screening done: Once every three years after age 53 if you are at a normal weight and have a low risk for diabetes. More often and at a younger age if you are overweight or have a high risk for diabetes. What should I know about preventing infection? Hepatitis B If you have a higher risk for hepatitis B, you should be screened for this virus. Talk with your health care provider to find out if you are at risk for hepatitis B infection. Hepatitis C Testing is recommended for: Everyone born from [birth year] through 1963. Anyone with known risk factors for hepatitis C. Sexually transmitted infections (STIs) Get screened for STIs, including gonorrhea and chlamydia, if: You are sexually active and are younger than 88 years of age. You are older than 88 years of age and your health care provider tells you that you are at risk for  this type of infection. Your sexual activity has changed since you were last screened, and you are at increased risk for chlamydia or gonorrhea. Ask your health care provider if you are at risk. Ask your health care provider about whether you are at high risk for HIV. Your health care provider may recommend a prescription medicine to help prevent HIV infection. If you choose to take medicine to prevent HIV, you should first get  tested for HIV. You should then be tested every 3 months for as long as you are taking the medicine. Pregnancy If you are about to stop having your period (premenopausal) and you may become pregnant, seek counseling before you get pregnant. Take 400 to 800 micrograms (mcg) of folic acid every day if you become pregnant. Ask for birth control (contraception) if you want to prevent pregnancy. Osteoporosis and menopause Osteoporosis is a disease in which the bones lose minerals and strength with aging. This can result in bone fractures. If you are 16 years old or older, or if you are at risk for osteoporosis and fractures, ask your health care provider if you should: Be screened for bone loss. Take a calcium or vitamin D  supplement to lower your risk of fractures. Be given hormone replacement therapy (HRT) to treat symptoms of menopause. Follow these instructions at home: Alcohol use Do not drink alcohol if: Your health care provider tells you not to drink. You are pregnant, may be pregnant, or are planning to become pregnant. If you drink alcohol: Limit how much you have to: 0-1 drink a day. Know how much alcohol is in your drink. In the [place] one drink equals one 12 oz bottle of beer (355 mL), one 5 oz glass of wine (148 mL), or one 1 oz glass of hard liquor (44 mL). Lifestyle Do not use any products that contain nicotine or tobacco. These products include cigarettes, chewing tobacco, and vaping devices, such as e-cigarettes. If you need help quitting, ask your health care provider. Do not use street drugs. Do not share needles. Ask your health care provider for help if you need support or information about quitting drugs. General instructions Schedule regular health, dental, and eye exams. Stay current with your vaccines. Tell your health care provider if: You often feel depressed. You have ever been abused or do not feel safe at home. Summary Adopting a healthy lifestyle and getting  preventive care are important in promoting health and wellness. Follow your health care provider's instructions about healthy diet, exercising, and getting tested or screened for diseases. Follow your health care provider's instructions on monitoring your cholesterol and blood pressure. This information is not intended to replace advice given to you by your health care provider. Make sure you discuss any questions you have with your health care provider. Document Revised: 07/20/2020 Document Reviewed: 07/20/2020 Elsevier Patient Education  2024 [employer].

## 2023-12-20 ENCOUNTER — Ambulatory Visit: Admitting: Internal Medicine

## 2023-12-20 VITALS — BP 130/70 | HR 70 | Temp 98.0°F | Ht 61.0 in | Wt 138.0 lb

## 2023-12-20 DIAGNOSIS — I10 Essential (primary) hypertension: Secondary | ICD-10-CM | POA: Diagnosis not present

## 2023-12-20 DIAGNOSIS — R1032 Left lower quadrant pain: Secondary | ICD-10-CM

## 2023-12-20 DIAGNOSIS — Z0000 Encounter for general adult medical examination without abnormal findings: Secondary | ICD-10-CM

## 2023-12-20 DIAGNOSIS — I700 Atherosclerosis of aorta: Secondary | ICD-10-CM | POA: Diagnosis not present

## 2023-12-20 DIAGNOSIS — R7303 Prediabetes: Secondary | ICD-10-CM | POA: Diagnosis not present

## 2023-12-20 DIAGNOSIS — F419 Anxiety disorder, unspecified: Secondary | ICD-10-CM | POA: Diagnosis not present

## 2023-12-20 DIAGNOSIS — M810 Age-related osteoporosis without current pathological fracture: Secondary | ICD-10-CM | POA: Diagnosis not present

## 2023-12-20 DIAGNOSIS — R42 Dizziness and giddiness: Secondary | ICD-10-CM

## 2023-12-20 DIAGNOSIS — N1832 Chronic kidney disease, stage 3b: Secondary | ICD-10-CM | POA: Diagnosis not present

## 2023-12-20 LAB — COMPREHENSIVE METABOLIC PANEL WITH GFR
ALT: 11 U/L (ref 0–35)
AST: 15 U/L (ref 0–37)
Albumin: 4.1 g/dL (ref 3.5–5.2)
Alkaline Phosphatase: 75 U/L (ref 39–117)
BUN: 28 mg/dL — ABNORMAL HIGH (ref 6–23)
CO2: 31 meq/L (ref 19–32)
Calcium: 9.7 mg/dL (ref 8.4–10.5)
Chloride: 104 meq/L (ref 96–112)
Creatinine, Ser: 1.09 mg/dL (ref 0.40–1.20)
GFR: 45.22 mL/min — ABNORMAL LOW (ref >60.00)
Glucose, Bld: 78 mg/dL (ref 70–99)
Potassium: 4.4 meq/L (ref 3.5–5.1)
Sodium: 142 meq/L (ref 135–145)
Total Bilirubin: 0.9 mg/dL (ref 0.2–1.2)
Total Protein: 7 g/dL (ref 6.0–8.3)

## 2023-12-20 LAB — LIPID PANEL
Cholesterol: 205 mg/dL — ABNORMAL HIGH (ref 0–200)
HDL: 84 mg/dL (ref >39.00)
LDL Cholesterol: 89 mg/dL (ref 0–99)
NonHDL: 120.8
Total CHOL/HDL Ratio: 2
Triglycerides: 161 mg/dL — ABNORMAL HIGH (ref 0.0–149.0)
VLDL: 32.2 mg/dL (ref 0.0–40.0)

## 2023-12-20 LAB — CBC
HCT: 40.9 % (ref 36.0–46.0)
Hemoglobin: 13.3 g/dL (ref 12.0–15.0)
MCHC: 32.5 g/dL (ref 30.0–36.0)
MCV: 93.4 fl (ref 78.0–100.0)
Platelets: 145 K/uL — ABNORMAL LOW (ref 150.0–400.0)
RBC: 4.38 Mil/uL (ref 3.87–5.11)
RDW: 13.9 % (ref 11.5–15.5)
WBC: 4.5 K/uL (ref 4.0–10.5)

## 2023-12-20 LAB — HEMOGLOBIN A1C: Hgb A1c MFr Bld: 5.7 % (ref 4.6–6.5)

## 2023-12-20 LAB — TSH: TSH: 1.56 u[IU]/mL (ref 0.35–5.50)

## 2023-12-20 NOTE — Assessment & Plan Note (Addendum)
 Chronic Blood pressure has been well-controlled at home and is very well-controlled here today cmp, CBC Continue benazepril  5 mg daily  EKG: Normal sinus rhythm at 73 bpm, normal EKG.  No change compared to EKG from 2018.

## 2023-12-20 NOTE — Assessment & Plan Note (Signed)
 Chronic Lab Results  Component Value Date   HGBA1C 5.6 06/09/2023   Check a1c Low sugar / carb diet Stressed regular exercise

## 2023-12-20 NOTE — Assessment & Plan Note (Signed)
Chronic Kidney function has been very stable Blood pressure well-controlled CMP, CBC

## 2023-12-20 NOTE — Assessment & Plan Note (Signed)
 Chronic Intermittent Often related with position changes or sleeping on her side Plans on seeing ENT for further vestibular studies-referral ordered-has an appointment the end of this month Does have episodes of a pulling sensation when sitting up and that that pulls her back into bed-saw neurology and they did not feel this was vestibular in nature Has been referred for physical therapy

## 2023-12-20 NOTE — Assessment & Plan Note (Signed)
 Chronic Lab Results  Component Value Date   LDLCALC 96 06/09/2023   deferred statin Check lipids, cmp, tsh Encouraged regular exercise, healthy diet

## 2023-12-20 NOTE — Assessment & Plan Note (Signed)
 Acute Started a couple of months ago after picking up something heavy Describes the pain as an Keke pain, stinging at times Can occur daily, but not necessarily Pain does get better after a bowel movement sometimes so could be related to some constipation Sometimes pain occurs with certain activities or changes in position-?  Possible hernia or strain Advised taking a stool softener daily since it sounds like sometimes she does have some constipation Discussed that we may need to consider getting imaging-she would like to hold off on that for now and will let me know if there is no improvement at which time we may need to do a CT scan to evaluate for possible hernia

## 2023-12-20 NOTE — Assessment & Plan Note (Signed)
Chronic Controlled, Stable Continue sertraline 25 mg daily 

## 2023-12-20 NOTE — Assessment & Plan Note (Signed)
 Chronic DEXA up to date Continue calcium, vitamin D  daily Encourage as much activity as possible Did not tolerate Fosamax in the past Check vitamin D  level Recommended Prolia previously

## 2023-12-21 ENCOUNTER — Ambulatory Visit: Payer: Self-pay | Admitting: Internal Medicine

## 2024-01-03 DIAGNOSIS — Z961 Presence of intraocular lens: Secondary | ICD-10-CM | POA: Diagnosis not present

## 2024-01-03 DIAGNOSIS — H353131 Nonexudative age-related macular degeneration, bilateral, early dry stage: Secondary | ICD-10-CM | POA: Diagnosis not present

## 2024-01-04 DIAGNOSIS — H8110 Benign paroxysmal vertigo, unspecified ear: Secondary | ICD-10-CM | POA: Diagnosis not present

## 2024-01-04 DIAGNOSIS — H8112 Benign paroxysmal vertigo, left ear: Secondary | ICD-10-CM | POA: Diagnosis not present

## 2024-01-04 DIAGNOSIS — Z974 Presence of external hearing-aid: Secondary | ICD-10-CM | POA: Diagnosis not present

## 2024-01-04 DIAGNOSIS — H903 Sensorineural hearing loss, bilateral: Secondary | ICD-10-CM | POA: Diagnosis not present

## 2024-01-04 DIAGNOSIS — Z79899 Other long term (current) drug therapy: Secondary | ICD-10-CM | POA: Diagnosis not present

## 2024-01-05 ENCOUNTER — Encounter: Payer: Self-pay | Admitting: Internal Medicine

## 2024-01-15 ENCOUNTER — Encounter: Payer: Self-pay | Admitting: Radiology

## 2024-01-18 DIAGNOSIS — Z79899 Other long term (current) drug therapy: Secondary | ICD-10-CM | POA: Diagnosis not present

## 2024-01-18 DIAGNOSIS — R42 Dizziness and giddiness: Secondary | ICD-10-CM | POA: Diagnosis not present

## 2024-01-18 DIAGNOSIS — H8112 Benign paroxysmal vertigo, left ear: Secondary | ICD-10-CM | POA: Diagnosis not present

## 2024-01-18 DIAGNOSIS — Z9181 History of falling: Secondary | ICD-10-CM | POA: Diagnosis not present

## 2024-01-18 DIAGNOSIS — H903 Sensorineural hearing loss, bilateral: Secondary | ICD-10-CM | POA: Diagnosis not present

## 2024-02-14 ENCOUNTER — Encounter: Payer: Self-pay | Admitting: Internal Medicine

## 2024-02-21 NOTE — Addendum Note (Signed)
 Addended by: GEOFM GLADE PARAS on: 02/21/2024 08:39 PM   Modules accepted: Orders

## 2024-02-26 ENCOUNTER — Inpatient Hospital Stay
Admission: RE | Admit: 2024-02-26 | Discharge: 2024-02-26 | Disposition: A | Source: Ambulatory Visit | Attending: Internal Medicine

## 2024-02-26 ENCOUNTER — Ambulatory Visit: Payer: Self-pay | Admitting: Internal Medicine

## 2024-02-26 DIAGNOSIS — R1032 Left lower quadrant pain: Secondary | ICD-10-CM

## 2024-02-26 DIAGNOSIS — R194 Change in bowel habit: Secondary | ICD-10-CM

## 2024-02-26 MED ORDER — IOPAMIDOL (ISOVUE-300) INJECTION 61%
80.0000 mL | Freq: Once | INTRAVENOUS | Status: AC | PRN
  Administered 2024-02-26: 13:00:00 80 mL via INTRAVENOUS

## 2024-02-27 ENCOUNTER — Encounter: Payer: Self-pay | Admitting: Internal Medicine

## 2024-03-02 ENCOUNTER — Other Ambulatory Visit: Payer: Self-pay | Admitting: Internal Medicine

## 2024-03-08 ENCOUNTER — Encounter: Payer: Self-pay | Admitting: Internal Medicine

## 2024-03-08 NOTE — Telephone Encounter (Signed)
 Please advise as Md is out of office

## 2024-03-26 ENCOUNTER — Encounter: Payer: Self-pay | Admitting: Internal Medicine

## 2024-06-19 ENCOUNTER — Ambulatory Visit: Admitting: Internal Medicine

## 2024-11-21 ENCOUNTER — Ambulatory Visit
